# Patient Record
Sex: Female | Born: 1980
Health system: Southern US, Community
[De-identification: ages and names within clinical notes are randomized; demographics above are authoritative.]

## PROBLEM LIST (undated history)

## (undated) DIAGNOSIS — A419 Sepsis, unspecified organism: Secondary | ICD-10-CM

## (undated) DIAGNOSIS — R7881 Bacteremia: Secondary | ICD-10-CM

## (undated) DIAGNOSIS — K219 Gastro-esophageal reflux disease without esophagitis: Secondary | ICD-10-CM

## (undated) DIAGNOSIS — K649 Unspecified hemorrhoids: Secondary | ICD-10-CM

## (undated) DIAGNOSIS — R109 Unspecified abdominal pain: Secondary | ICD-10-CM

## (undated) DIAGNOSIS — Z124 Encounter for screening for malignant neoplasm of cervix: Secondary | ICD-10-CM

## (undated) HISTORY — DX: Bacteremia: R78.81

## (undated) HISTORY — DX: Unspecified abdominal pain: R10.9

## (undated) HISTORY — DX: Encounter for screening for malignant neoplasm of cervix: Z12.4

## (undated) HISTORY — DX: Unspecified hemorrhoids: K64.9

## (undated) HISTORY — DX: Sepsis, unspecified organism: A41.9

---

## 2000-10-18 ENCOUNTER — Inpatient Hospital Stay (HOSPITAL_COMMUNITY): Admission: AD | Admit: 2000-10-18 | Discharge: 2000-10-18 | Payer: Self-pay | Admitting: Obstetrics & Gynecology

## 2000-10-27 ENCOUNTER — Other Ambulatory Visit: Admission: RE | Admit: 2000-10-27 | Discharge: 2000-10-27 | Payer: Self-pay | Admitting: Obstetrics and Gynecology

## 2000-11-24 ENCOUNTER — Encounter: Payer: Self-pay | Admitting: Obstetrics and Gynecology

## 2000-11-24 ENCOUNTER — Ambulatory Visit (HOSPITAL_COMMUNITY): Admission: RE | Admit: 2000-11-24 | Discharge: 2000-11-24 | Payer: Self-pay | Admitting: Obstetrics and Gynecology

## 2001-03-29 ENCOUNTER — Emergency Department (HOSPITAL_COMMUNITY): Admission: EM | Admit: 2001-03-29 | Discharge: 2001-03-29 | Payer: Self-pay

## 2001-04-26 ENCOUNTER — Inpatient Hospital Stay (HOSPITAL_COMMUNITY): Admission: AD | Admit: 2001-04-26 | Discharge: 2001-04-30 | Payer: Self-pay | Admitting: Obstetrics and Gynecology

## 2001-06-10 ENCOUNTER — Other Ambulatory Visit: Admission: RE | Admit: 2001-06-10 | Discharge: 2001-06-10 | Payer: Self-pay | Admitting: Obstetrics and Gynecology

## 2002-01-10 ENCOUNTER — Other Ambulatory Visit: Admission: RE | Admit: 2002-01-10 | Discharge: 2002-01-10 | Payer: Self-pay | Admitting: Obstetrics and Gynecology

## 2002-09-19 ENCOUNTER — Other Ambulatory Visit: Admission: RE | Admit: 2002-09-19 | Discharge: 2002-09-19 | Payer: Self-pay | Admitting: Obstetrics and Gynecology

## 2004-09-22 ENCOUNTER — Emergency Department (HOSPITAL_COMMUNITY): Admission: EM | Admit: 2004-09-22 | Discharge: 2004-09-22 | Payer: Self-pay | Admitting: Family Medicine

## 2005-07-23 ENCOUNTER — Ambulatory Visit: Payer: Self-pay | Admitting: Family Medicine

## 2005-08-11 ENCOUNTER — Emergency Department (HOSPITAL_COMMUNITY): Admission: EM | Admit: 2005-08-11 | Discharge: 2005-08-11 | Payer: Self-pay | Admitting: Family Medicine

## 2006-01-01 ENCOUNTER — Emergency Department (HOSPITAL_COMMUNITY): Admission: EM | Admit: 2006-01-01 | Discharge: 2006-01-01 | Payer: Self-pay | Admitting: Family Medicine

## 2006-01-02 ENCOUNTER — Emergency Department (HOSPITAL_COMMUNITY): Admission: EM | Admit: 2006-01-02 | Discharge: 2006-01-02 | Payer: Self-pay | Admitting: Family Medicine

## 2006-04-14 ENCOUNTER — Emergency Department (HOSPITAL_COMMUNITY): Admission: EM | Admit: 2006-04-14 | Discharge: 2006-04-14 | Payer: Self-pay | Admitting: Emergency Medicine

## 2006-04-17 ENCOUNTER — Emergency Department (HOSPITAL_COMMUNITY): Admission: EM | Admit: 2006-04-17 | Discharge: 2006-04-17 | Payer: Self-pay | Admitting: Family Medicine

## 2006-05-12 ENCOUNTER — Emergency Department (HOSPITAL_COMMUNITY): Admission: EM | Admit: 2006-05-12 | Discharge: 2006-05-12 | Payer: Self-pay | Admitting: Emergency Medicine

## 2006-07-23 ENCOUNTER — Emergency Department (HOSPITAL_COMMUNITY): Admission: EM | Admit: 2006-07-23 | Discharge: 2006-07-24 | Payer: Self-pay | Admitting: Emergency Medicine

## 2007-01-06 ENCOUNTER — Other Ambulatory Visit: Admission: RE | Admit: 2007-01-06 | Discharge: 2007-01-06 | Payer: Self-pay | Admitting: Family Medicine

## 2008-02-14 ENCOUNTER — Inpatient Hospital Stay (HOSPITAL_COMMUNITY): Admission: AD | Admit: 2008-02-14 | Discharge: 2008-02-17 | Payer: Self-pay | Admitting: Obstetrics and Gynecology

## 2008-02-14 ENCOUNTER — Encounter (INDEPENDENT_AMBULATORY_CARE_PROVIDER_SITE_OTHER): Payer: Self-pay | Admitting: Obstetrics and Gynecology

## 2008-03-12 ENCOUNTER — Encounter: Admission: RE | Admit: 2008-03-12 | Discharge: 2008-03-12 | Payer: Self-pay | Admitting: Obstetrics and Gynecology

## 2008-12-25 ENCOUNTER — Encounter: Payer: Self-pay | Admitting: Family Medicine

## 2008-12-25 ENCOUNTER — Ambulatory Visit: Payer: Self-pay | Admitting: Family Medicine

## 2010-03-20 ENCOUNTER — Encounter: Payer: Self-pay | Admitting: Family Medicine

## 2010-03-20 ENCOUNTER — Ambulatory Visit: Payer: Self-pay | Admitting: Family Medicine

## 2010-03-20 ENCOUNTER — Ambulatory Visit (HOSPITAL_COMMUNITY): Admission: RE | Admit: 2010-03-20 | Discharge: 2010-03-20 | Payer: Self-pay | Admitting: Family Medicine

## 2010-03-20 DIAGNOSIS — M545 Low back pain: Secondary | ICD-10-CM

## 2010-03-26 ENCOUNTER — Encounter: Payer: Self-pay | Admitting: Family Medicine

## 2010-03-26 DIAGNOSIS — Q762 Congenital spondylolisthesis: Secondary | ICD-10-CM

## 2010-04-01 ENCOUNTER — Encounter: Payer: Self-pay | Admitting: Family Medicine

## 2010-04-01 ENCOUNTER — Ambulatory Visit: Payer: Self-pay | Admitting: Family Medicine

## 2010-04-18 ENCOUNTER — Ambulatory Visit: Payer: Self-pay | Admitting: Family Medicine

## 2010-04-29 ENCOUNTER — Encounter: Payer: Self-pay | Admitting: Family Medicine

## 2010-05-06 ENCOUNTER — Encounter: Admission: RE | Admit: 2010-05-06 | Payer: Self-pay | Source: Home / Self Care | Admitting: Family Medicine

## 2010-06-03 NOTE — Assessment & Plan Note (Signed)
Summary: back pain   Vital Signs:  Patient profile:   30 year old female Weight:      144.3 pounds BMI:     28.05 Temp:     98.1 degrees F oral Pulse rate:   88 / minute Pulse rhythm:   regular BP sitting:   131 / 80  (left arm)  Vitals Entered By: Loralee Pacas CMA (March 20, 2010 8:41 AM) CC: back pain and leg pain Is Patient Diabetic? No   Primary Care Provider:  Jamie Brookes  CC:  back pain and leg pain.  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 Went home and took Tylenol, and has been able to get the pain under control enough to work. It has been progressively getting worse the last year and now is hurting all the time. When sitting it hurts in her lower Rt leg and when standing it hurts in her back. Has "tight" feeling on the anterior portion of the shin. Pt has not been exercising since this happened. Says tht she "crawls" to her car after working. Left let hurts too but Rt leg is worse.   Habits & Providers  Alcohol-Tobacco-Diet     Alcohol drinks/day: 0     Tobacco Status: never  Current Medications (verified): 1)  Flexeril 10 Mg Tabs (Cyclobenzaprine Hcl) .... Take 1 Pill At Night To Help Muscles Relax and To Help You Sleep. 2)  Ibuprofen 800 Mg Tabs (Ibuprofen) .... Take 1 Pill With Food Every 8 Hours To Decrease Back Pain  Allergies (verified): No Known Drug Allergies  Review of Systems        vitals reviewed and pertinent negatives and positives seen in HPI   Physical Exam  General:  Well-developed,well-nourished,in no acute distress; alert,appropriate and cooperative throughout examination vital signs reviewed Msk:  pain with leaning forward, no pain with leaning back, tenderness over L3-5, pain over SI joint on Rt.  Rt straight leg illicits pain. tenderness over anterior tibia bilaterally. no erythema, no joint swelling   Impression & Recommendations:  Problem # 1:   BACK PAIN, LUMBAR (ICD-724.2) Assessment New Pt comes in with back pain that has worsened over the last year. She is going to get an x-ray today and will start taking Flexeril and Ibuprofen.  If not better in 2 weeks will consider PT. Asked interpretor to tell the patient all instructions and help her make an appointment for 2 weeks.   Her updated medication list for this problem includes:    Flexeril 10 Mg Tabs (Cyclobenzaprine hcl) .Marland Kitchen... Take 1 pill at night to help muscles relax and to help you sleep.    Ibuprofen 800 Mg Tabs (Ibuprofen) .Marland Kitchen... Take 1 pill with food every 8 hours to decrease back pain  Orders: Diagnostic X-Ray/Fluoroscopy (Diagnostic X-Ray/Flu) [facility]- Est Level  3 (84132)  Complete Medication List: 1)  Flexeril 10 Mg Tabs (Cyclobenzaprine hcl) .... Take 1 pill at night to help muscles relax and to help you sleep. 2)  Ibuprofen 800 Mg Tabs (Ibuprofen) .... Take 1 pill with food every 8 hours to decrease back pain  Patient Instructions: 1)  Start taking the Ibuprofen every 8 hours for the first week.  2)  You can also use a heating pad on your back for pain relief.  3)  Take the Flexeril (cyclobenzoprine) only at night because it will make you very sleepy.  4)  I want to  see you back in 2 weeks.  Prescriptions: IBUPROFEN 800 MG TABS (IBUPROFEN) take 1 pill with food every 8 hours to decrease back pain  #60 x 1   Entered and Authorized by:   Jamie Brookes MD   Signed by:   Jamie Brookes MD on 03/20/2010   Method used:   Electronically to        [facility] [phone]* (retail)       [address]       [place], [place]  [address]       Ph: [phone]       Fax: [phone]   RxID:   [id number] FLEXERIL 10 MG TABS (CYCLOBENZAPRINE HCL) take 1 pill at night to help muscles relax and to help you sleep.  #15 x 0   Entered and Authorized by:   Jamie Brookes MD   Signed by:   Jamie Brookes MD on 03/20/2010   Method used:   Electronically to        [facility] [phone]* (retail)       [address]       [place], [place]  [address]       Ph: [phone]       Fax: [phone]   RxID:   [id number]    Orders Added: 1)  Diagnostic X-Ray/Fluoroscopy [Diagnostic X-Ray/Flu] 2)  [facility]- Est Level  3 [[address]053]

## 2010-06-03 NOTE — Miscellaneous (Signed)
Summary: Back referral  Clinical Lists Changes  Problems: Added new problem of SPONDYLOLISTHESIS (ZHY-865.78) Orders: Added new Referral order of Orthopedic Surgeon Referral (Ortho Surgeon) - Signed

## 2010-06-03 NOTE — Letter (Signed)
Summary: Generic Letter  [facility]  [address]   [place], [place] [address]   Phone: [phone]  Fax: [phone]    04/01/2010    Dear Supervisor of [name],  This patient is being evaluated for back pain and my office is working on getting her in to see a back specialist. Limit her lifting to no more than 10 lbs while we are trying to get her into the specialist. If you have any questions please contact my office.      Sincerely,   Jamie Brookes MD

## 2010-06-03 NOTE — Assessment & Plan Note (Signed)
Summary: back pain f/u   Vital Signs:  Patient profile:   30 year old female Weight:      144.9 pounds Temp:     98.1 degrees F oral Pulse rate:   71 / minute Pulse rhythm:   regular BP sitting:   109 / 80  (left arm) Cuff size:   regular  Vitals Entered By: Loralee Pacas CMA (April 01, 2010 9:35 AM) CC: back pain Is Patient Diabetic? No   Primary Care [place]:  Jamie Brookes  CC:  back pain.  History of Present Illness: Back pain: Interpretor Mr. Vicki Mallet present.  No change in pain, pt is still working. Pain medicine is helping when she takes it. The Flexerill makes her very sleepy so she is using it only at night as directed. Discussed x-rays. Discussed need for back surgeon to take a look at her. Pt agrees.    Habits & Providers  Alcohol-Tobacco-Diet     Alcohol drinks/day: 0     Tobacco Status: never  Exercise-Depression-Behavior     Have you felt down or hopeless? no     Have you felt little pleasure in things? no     Depression Counseling: not indicated; screening negative for depression     Seat Belt Use: always  Current Medications (verified): 1)  Flexeril 10 Mg Tabs (Cyclobenzaprine Hcl) .... Take 1 Pill At Night To Help Muscles Relax and To Help You Sleep. 2)  Ibuprofen 800 Mg Tabs (Ibuprofen) .... Take 1 Pill With Food Every 8 Hours To Decrease Back Pain  Allergies (verified): No Known Drug Allergies  Physical Exam  General:  Well-developed,well-nourished,in no acute distress; alert,appropriate and cooperative throughout examination   Impression & Recommendations:  Problem # 1:  SPONDYLOLISTHESIS (HKV-425.95) Assessment Unchanged Pt found to have spondylolisthesis. Referral for orthopedic surgeon done already. 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 Answered questions. Will try to get Mr Vicki Mallet to be her contact person when she goes to appointments.   Orders: [facility]- Est Level  2 ([address]75)  Complete Medication List: 1)   Flexeril 10 Mg Tabs (Cyclobenzaprine hcl) .... Take 1 pill at night to help muscles relax and to help you sleep. 2)  Ibuprofen 800 Mg Tabs (Ibuprofen) .... Take 1 pill with food every 8 hours to decrease back pain  Patient Instructions: 1)  Do not lift anything over 10 lbs.  2)  We will call you with an appointment date and time to see the back doctor.    Orders Added: 1)  [facility]- Est Level  2 [64332]

## 2010-06-03 NOTE — Letter (Signed)
Summary: Out of Work  [facility]  [address]   [place], [place] [address]   Phone: [phone]  Fax: [phone]    March 20, 2010   Employee:  [name]    To Whom It May Concern:   For Medical reasons, please excuse the above named employee from work for the following dates:  Start:   03-20-10  End:   03-24-10  If you need additional information, please feel free to contact our office.         Sincerely,    Jamie Brookes MD

## 2010-06-05 NOTE — Letter (Signed)
Summary: Generic Letter  [facility]  [address]   [place], [place] [address]   Phone: [phone]  Fax: [phone]    04/29/2010  [name] [address] [place], [place]  [address]  Dear [name],  Your pap smear was normal. You will need another on in 2 years.     Sincerely,   Jamie Brookes MD

## 2010-06-05 NOTE — Assessment & Plan Note (Signed)
Summary: cpe with pap/kh   normal.    Vital Signs:  Patient profile:   30 year old female Height:      60.25 inches Weight:      144 pounds BMI:     27.99 Temp:     97.9 degrees F oral Pulse rate:   72 / minute BP sitting:   114 / 73  (left arm) Cuff size:   regular  Vitals Entered By: Jimmy Footman, CMA (April 18, 2010 9:39 AM) CC: cpe/pap Is Patient Diabetic? No Pain Assessment Patient in pain? yes     Location: back Intensity: 5 Type: sharp   Primary Care Diamante Rubin:  Jamie Brookes  CC:  cpe/pap.  History of Present Illness:  Pt is still having back pain but has no other concerns. She is doing lighter duty at work, taking care of her children and has done 2 sessions of PT for her back (which are expensive so we are going to switch her over to the [facility] PT).  Pt here for a pap as well today. Pt is taking oral contraceptives but can't remember the name. She gets them from the health department.   Habits & Providers  Alcohol-Tobacco-Diet     Tobacco Status: never  Current Medications (verified): 1)  Flexeril 10 Mg Tabs (Cyclobenzaprine Hcl) .... Take 1 Pill At Night To Help Muscles Relax and To Help You Sleep. 2)  Ibuprofen 800 Mg Tabs (Ibuprofen) .... Take 1 Pill With Food Every 8 Hours To Decrease Back Pain  Allergies (verified): No Known Drug Allergies  Family History: Reviewed history from 12/25/2008 and no changes required. Father and Mother died when she was young. They were killed by communist regime in [place].  Social History: Reviewed history from 12/25/2008 and no changes required. works in Dietitian at [employer], married to [employer], has 2 children (H'Lean Rahland 04-27-01 and Myishia Kasik 02-14-08) both born at [facility]. no pets, healthy diet  Review of Systems        vitals reviewed and pertinent negatives and positives seen in HPI   Physical Exam  General:  Well-developed,well-nourished,in no acute distress; alert,appropriate and  cooperative throughout examination Head:  Normocephalic and atraumatic without obvious abnormalities. No apparent alopecia or balding. Eyes:  No corneal or conjunctival inflammation noted. EOMI. Perrla.Vision grossly normal. Ears:  External ear exam shows no significant lesions or deformities.  Otoscopic examination reveals clear canals, tympanic membranes are intact bilaterally without bulging, retraction, inflammation or discharge. Hearing is grossly normal bilaterally. Nose:  External nasal examination shows no deformity or inflammation. Nasal mucosa are pink and moist without lesions or exudates. Mouth:  Oral mucosa and oropharynx without lesions or exudates.  Teeth in fair repair Lungs:  Normal respiratory effort, chest expands symmetrically. Lungs are clear to auscultation, no crackles or wheezes. Heart:  Normal rate and regular rhythm. S1 and S2 normal without gallop, murmur, click, rub or other extra sounds. Abdomen:  Bowel sounds positive,abdomen soft and non-tender without masses, organomegaly or hernias noted. Genitalia:  Normal introitus for age, no external lesions, no vaginal discharge, mucosa pink and moist, no vaginal or cervical lesions, no vaginal atrophy, no friaility or hemorrhage, normal uterus size and position, no adnexal masses or tenderness Msk:  No deformity or scoliosis noted of thoracic or lumbar spine.   Extremities:  No clubbing, cyanosis, edema, or deformity noted with normal full range of motion of all joints.   Neurologic:  No cranial nerve deficits noted. Station and gait are  normal.  Skin:  Intact without suspicious lesions or rashes Psych:  Cognition and judgment appear intact. Alert and cooperative with normal attention span and concentration. No apparent delusions, illusions, hallucinations   Impression & Recommendations:  Problem # 1:  HEALTH MAINTENANCE EXAM (ICD-V70.0) Assessment Unchanged  Pt is doing well, no major concerns other than her back pain.  she is going to PT and we are going to try to help her get PT at the [facility] facility so she doesn't have to pay 100 dollars every time she sees them.   Orders: [facility] - Est  18-39 yrs ([id number])  Problem # 2:  SCREENING FOR MALIGNANT NEOPLASM OF THE CERVIX (ICD-V76.2) Assessment: Unchanged Pap done today. Will call with results.   Orders: Pap Smear-[facility] ([id number]) [facility] - Est  18-39 yrs ([address]65)  Complete Medication List: 1)  Flexeril 10 Mg Tabs (Cyclobenzaprine hcl) .... Take 1 pill at night to help muscles relax and to help you sleep. 2)  Ibuprofen 800 Mg Tabs (Ibuprofen) .... Take 1 pill with food every 8 hours to decrease back pain  Other Orders: Physical Therapy Referral (PT)  Patient Instructions: 1)  Call dentist at [phone] 2)  I will send you a letter with your results from today's pap smear.  3)  We will call you with the new time/date for the Physical Therapy.  4)  You don't need to schedule another appointment for 1 year, but please feel free to come in sooner if you have questions or concerns.    Orders Added: 1)  Pap Smear-[facility] [[id number]] 2)  Physical Therapy Referral [PT] 3)  [facility] - Est  18-39 yrs [[id number]]

## 2010-09-16 NOTE — H&P (Signed)
NAMEJOSSELYN, Avila                    ACCOUNT NO.:  [id number]   MEDICAL RECORD NO.:  [id number]          PATIENT TYPE:  MAT   LOCATION:  [facility]                          [facility]ILITY:  [facility]   PHYSICIAN:  Osborn Coho, M.D.   DATE OF BIRTH:  08-Oct-1980   DATE OF ADMISSION:  02/14/2008  DATE OF DISCHARGE:                              HISTORY & PHYSICAL   [name] is a 30 year old single Asian female gravida 2, para 1-0-0-1 at 33-  6/7 weeks who presented with chief complaint of SROM at approximately  7:15 a.m. meconium and in active labor.  She presented to the office and  per Nigel Bridgeman, CNM, exam was 5-6 cm dilated with breech presentation.  She was immediately sent to [facility] via EMS to  proceed with a repeat cesarean section secondary to breech presentation.  She had been followed by the CNM service at [facility].   HISTORY:  1. Previous C-section had previously desired a VBAC.  2. History of abnormal Pap  3. Language barrier.  4. Reflux.  5. Group beta strep negative.   OBSTETRICAL HISTORY:  Rhett Bannister 1 was a C-section female in December 2002  by the name of Leah weighing 7 pounds 11 ounces at [redacted] weeks gestation  after 48 hours of labor.  That was by Dr. Pennie Rushing.  Vacuum-assisted.  Gravida 2 is current pregnancy.  Just to note, first delivery with  failure to descend after pushing for 2 hours.   PAST MEDICAL HISTORY:  She denies medication or latex allergies.  Has  used Depo-Provera for birth control in the past.  Last Pap smear was in  January 2009 was normal at [facility].  She reports menarche at age 21.  Cycles every 28-29 days, 3 days in length.  Just fatigue during her  cycles, but no other abnormalities.  She reported an LMP of July 02, 2007 giving her a best [facility] of April 07, 2008.  She had an early  ultrasound with size less than dates giving her best [facility] of February 29, 2008 via ultrasound.  She does have a history of acid reflux and chronic  constipation.   The patient does not know her family history.  Her  surgical history is remarkable for the previous C-section.  There is no  genetic history.   SOCIAL HISTORY:  She is a single [place] female.  She is of [place]  descent.  Father of baby's name is Geophysicist/field seismologist.  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  She denied alcohol, tobacco or  illicit drug use.  She entered care August 24, 2007 for new OB interview  was approximately 7-4/7 weeks and she returned for new OB workup on May  11 was approximately 10-2/7 weeks.  Planned CNM care, hoped for VBAC.  Was prescribed Tums for reflux.  Her weight was 133.  She reported that  her pre gravid weight was 112.  She is 5 feet tall.  Fetal heart tones  were heard that day.   PRENATAL LABS:  Blood type is  O positive, Rh antibody screen negative.  Sickle cell negative.  RPR nonreactive, rubella titer immune.  Hepatitis  surface antigen negative, HIV nonreactive.  She had a urinary tract  infection July 18, 2007.  Gonorrhea and chlamydia cultures were  declined.  Hematocrit 38.9 and hemoglobin 12.2, platelets were 171.  First trimester screen was within normal limits.  EDC was changed to  February 29, 2008 on Sep 29, 2007.  June 25, complained of some left hip  pain especially when she was sleeping.  Comfort measures were reviewed.  Continued to complain of some reflux.  She was given a note for her  employer for frequent breaks, no heavy lifting, left hip sciatica  continued into latter part of second trimester.  She was prescribed some  Vicodin for that with some relief.  Had her Glucola at 27-5/7 weeks and  was within normal limits equal to 121.  Her hemoglobin at that time was  11.9.  She is expecting another female.  She saw Dr. Pennie Rushing around 30  weeks for the [facility] consent.  She had GBS done at 33-6/7 weeks and it was  negative.  Pregnancy continued to progress without any other significant  complications until her presentation  today.  There is a language barrier  and she has had interpreters who have been with her in the past, Dorene Sorrow,  as well as her for [employer].  She did not have anybody with her this  morning.   OBJECTIVE:  VITAL SIGNS:  Blood pressure on admission 95/61, heart rate  59.  She is afebrile.  Fetal heart rate 133 reactive.  No decelerations  with moderate variability.  Toco uterine contractions every 2 to 3  minutes.  GENERAL:  Labored breathing with moderate perspiration with her  contractions.  She is alert and oriented x3.  HEENT:  Grossly intact and within normal limits.  CARDIOVASCULAR:  Regular rate and rhythm without murmur.  LUNGS:  Clear to auscultation bilaterally.  ABDOMEN:  Soft, nontender and gravid.  PELVIC:  Deferred.  She has not been examined since arrival to the  hospital since being at the office.  EXTREMITIES:  Without edema and no clonus.   IMPRESSION:  1. Intrauterine pregnancy at 37-6/7 weeks.  2. Active labor.  3. Breech presentation.  4. Meconium-stained fluid.  5. Previous C-section.   PLAN:  1. Admit to [facility] with Dr. Osborn Coho as      attending with routine preoperative orders.  2. Risks, benefits, alternatives discussed with the patient regarding      need for repeat C-section secondary to breech presentation and the      patient is agreeable to proceed.  The risks benefits and      alternatives were explained via help of translator on language      line.      Candice Denny Levy, CNM      ______________________________  Osborn Coho, M.D.    CHS/MEDQ  D:  02/14/2008  T:  02/14/2008  Job:  [id number]

## 2010-09-16 NOTE — Op Note (Signed)
NAMESYD, MANGES                    ACCOUNT NO.:  [id number]   MEDICAL RECORD NO.:  [id number]          PATIENT TYPE:  INP   LOCATION:  9117                          [facility]ILITY:  [facility]   PHYSICIAN:  Osborn Coho, M.D.   DATE OF BIRTH:  1980/12/03   DATE OF PROCEDURE:  DATE OF DISCHARGE:                               OPERATIVE REPORT   PREOPERATIVE DIAGNOSES:  1. 37-6/7 weeks.  2. Spontaneous rupture of membranes in labor.  3. Breech.  4. Repeat C-section.   POSTOPERATIVE DIAGNOSES:  1. 37-6/7 weeks.  2. Spontaneous rupture of membranes in labor.  3. Breech.  4. Repeat C-section.   PROCEDURE:  Repeat C-section.   ATTENDING:  Osborn Coho, MD   ASSISTANT:  Larna Daughters, CNM   ANESTHESIA:  General via ET tube and failed spinal.   FLUIDS:  1800 mL.   URINE OUTPUT:  225 mL.   ESTIMATED BLOOD LOSS:  800 mL.   Placenta to Pathology.   FINDINGS:  Normal-appearing bilateral ovaries and fallopian tubes.  Live  female infant with Apgars of 9 at 1 minute and 9 at 5 minutes weighing 5  pounds 14 ounces.   COMPLICATIONS:  None.   PROCEDURE:  The patient was taken to the operating room, and after the  risks, benefits, and alternatives were discussed with the patient via  translator [phone], the risks included but were not limited to bleeding,  infection, and injury.  The patient verbalized understanding and consent  was signed and witnessed.  The patient was then taken to the operating  room where a spinal was administered per anesthesia, and the patient was  prepped and draped in a normal sterile fashion in the supine position.  A Pfannenstiel skin incision was made at the site of the prior scar and  carried out to the underlying layer of fascia with the scalpel.  The  fascia was excised bilaterally in the midline and extended bilaterally  with the Mayo scissors.  The Kocher clamps were placed on the inferior  aspect of fascial incision, and the rectus muscle excised  from the  fascia.  The same was done on the superior aspect of the fascial  incision.  The muscle was separated in midline, and the peritoneum  entered bluntly and extended manually.  The bladder blade was placed,  and bladder flap created with the Metzenbaum scissors.  The incision was  made with a scalpel and extended manually and with the bandage scissors  bilaterally.  The infant was delivered in the breech presentation with  the buttocks delivering first and the infant was delivered without  difficulty and cord clamped and cut, and infant handed to the waiting  pediatricians.  The infant was noted to immediately spontaneously cry,  and the placenta was removed via fundal massage.  The uterus was cleared  of all clots and debris, and the uterine incision was repaired with 0  Vicryl in a running locked fashion, and a second imbricating layer was  performed.  The intra-abdominal cavity was copiously irrigated, and  normal-appearing bilateral ovaries and  fallopian tubes were noted.  The  uterine incision was noted to be hemostatic, and the peritoneum was  repaired with 2-0 chromic in a running fashion.  The muscle was  irrigated and made hemostatic with the Bovie, and the fascia was  repaired with 0 Vicryl in a running fashion.  The subcutaneous tissue  was irrigated and made hemostatic with the Bovie and reapproximated  using 3 interrupted stitches of 2-0 plain.  The skin was reapproximated  using 3-0 Monocryl via a subcuticular stitch.  A small amount of  Dermabond was applied to the right lateral aspect of the incision, and  half-inch Steri-Strips were applied with Benzoin.  Pressure dressing was  applied as well.  Sponge, lap, and needle count was correct.  The  patient tolerated the procedure well and is currently awaiting  extubation and transfer to the recovery room in good condition.      Osborn Coho, M.D.  Electronically Signed     AR/MEDQ  D:  02/14/2008  T:   02/15/2008  Job:  [id number]

## 2010-09-16 NOTE — Discharge Summary (Signed)
[name], [name]                    ACCOUNT NO.:  [id number]   MEDICAL RECORD NO.:  [id number]          PATIENT TYPE:  INP   LOCATION:  9117                          FACILITY:  [facility]   PHYSICIAN:  Hal Morales, M.D.DATE OF BIRTH:  09-May-1980   DATE OF ADMISSION:  02/14/2008  DATE OF DISCHARGE:  02/17/2008                               DISCHARGE SUMMARY   ADMITTING DIAGNOSES:  1. Intrauterine pregnancy at 37-6/7th weeks.  2. Active labor.  3. Breech presentation.  4. Meconium-stained fluid.  5. Previous cesarean section.   PROCEDURE:  Repeat low-transverse cesarean section.   HOSPITAL COURSE:  [name] is a 30 year old gravida 2, para 1-0-0-1 at 58-  6/7th weeks who was admitted on February 14, 2008, in the morning from  the office secondary to spontaneous rupture of membrane at 7:15 a.m.  with meconium fluid noted.  Cervix was 5-6 and breech presentation was  identified in the office.  The patient was sent to [facility] via  EMS to proceed with repeat cesarean section.  Her history is remarkable  for:  1. Previous cesarean section with previous desire for VBAC.  2. History of abnormal pap.  3. Language barrier.  4. Reflux.  5. Group B strep negative.   The patient on arrival was taken to the operating room where a repeat  low-transverse cesarean section was performed by Dr. Su Hilt under  general anesthesia secondary to a failed spinal.  Findings were viable  female, by the name of [name], weight 5 pounds 14 ounces, Apgars were 9  and 9.  Infant was taken to the full-term nursing.  Mother was taken to  recovery in good condition.  On postop day #1, the patient is doing  well.  She was up ad lib. She is having no syncope or dizziness.  Her  throat was sore from the general anesthesia but had no other problems.  Hemoglobin was 9.7, white blood cell count 11.8, platelet count was 124.  Postoperative values, hemoglobin was 12.1, white blood cell count was  10.7, and platelet  count was 150.  Her physical exam was within normal  limits.  Her incision was clean, dry, and intact.  She had orthostatic  blood pressures and pulses done that were within normal limits.  She was  placed on iron 1 p.o. daily.  The rest of the patient's hospital stay  was uncomplicated.  By postop day #3, she was doing well.  She was up ad  lib.  Infant was doing well.  The patient was breastfeeding and bottle  feeding.  Her incision was clean, dry, and intact.  Fundus was firm.  Lochia was scant.  She was deemed to receive full benefit of hospital  stay and was discharged home.   DISCHARGE INSTRUCTIONS:  Per [facility] handout.   DISCHARGE MEDICATIONS:  Motrin 600 mg p.o. q.6 h. p.r.n. pain, Percocet  5/325 one to two p.o. q.2-4 h. p.r.n. pain.  The patient was then  decided regarding contraception.   DISCHARGE FOLLOWUP:  Occur in 6 weeks in [place]  OBRenaldo Reel Emilee Hero, C.N.M.      Hal Morales, M.D.  Electronically Signed    VLL/MEDQ  D:  02/17/2008  T:  02/17/2008  Job:  [id number]

## 2010-09-19 NOTE — Discharge Summary (Signed)
[facility]  PatientDENINE, [name] Visit Number: [id number] MRN: [id number]          Service Type: OBS Location: 910A 9136 01 Attending Physician:  Shaune Spittle Dictated by:   Philipp Deputy, C.N.M. Admit Date:  04/26/2001 Discharge Date: 04/30/2001                             Discharge Summary  DATE OF BIRTH:                February 15, 1981  ADMISSION DIAGNOSES:          1. Term pregnancy.                               2. Spontaneous rupture of membranes.                               3. Early labor.  DISCHARGE DIAGNOSES:          1. Term pregnancy.                               2. Failure to descend.                               3. Meconium-stained amniotic fluid.  PROCEDURE:                    1. Primary low transverse cesarean section.                               2. Epidural anesthesia.  HOSPITAL COURSE:              [name]. is a 30 year old married [place] female, gravida 1, para 0, at 41-2/7 weeks, who presented with leaking, thin meconium-stained fluid at 4 a.m. on April 26, 2001.  She was contracting approximately every three minutes and was very uncomfortable and was admitted for early labor and spontaneous rupture of membranes.  Her pregnancy has been followed by the [facility] M.D. service and has been essentially uncomplicated, but at risk for 1) Questionable last menstrual period; 2) First trimester exposure to MMR; 3) Language barrier; 4) Abnormal Pap smear. Patient progressed to 10 cm dilation at approximately midnight on April 27, 2001 and began pushing, which lasted greater than two hours, without any significant progress.  She brought the vertex down to a +1 to +2 station and options of cesarean section versus continued pushing were explained to the patient through the interpreter and patient wished to proceed with a cesarean section.  Patient tolerate the procedure well, with delivery of a  viable female infant.  Weight was 7 pounds and 11 ounces.  Apgars were 9 and 9. Infant was taken to the full-term nursery and mom was taken to recovery in good condition.  By postoperative day #1, patient was doing well.  Physical exam was within normal limits.  Her maximum temperature had reached 101 by 6:30 a.m. on April 27, 2001, with a gradual decrease since that time and no further temperature elevation.  Her hemoglobin was 8.8 and had been 12.6 upon admission.  She  was tolerating a regular diet and the rest of the postoperative stay was unremarkable.  She was breast-feeding and planned to discussed contraception at her six-week postpartum visit.  By postoperative day 3, she was deemed to have received the benefit of her hospital stay and was discharged home.  DISCHARGE INSTRUCTIONS:       Per [facility] handout.  DISCHARGE MEDICATIONS:        1. Motrin 600 mg p.o. q.6h. p.r.n. pain.                               2. Tylox 1-2 p.o. q.3-4h. p.r.n. pain.                               3. Iron supplement one p.o. b.i.d.  DISCHARGE FOLLOW-UP:          Will occur at six weeks at [facility].  All information was presented to the patient through her translator, Quita Skye. Dictated by:   Philipp Deputy, C.N.M. Attending Physician:  Shaune Spittle DD:  04/30/01 TD:  04/30/01 Job: [id number]

## 2010-09-19 NOTE — Op Note (Signed)
[facility]  PatientDAYSY, [name] Visit Number: [id number] MRN: [id number]          Service Type: OBS Location: 910A 9136 01 Attending Physician:  Shaune Spittle Dictated by:   Maris Berger. Pennie Rushing, M.D. Proc. Date: 04/27/01 Admit Date:  04/26/2001                             Operative Report  PREOPERATIVE DIAGNOSES:       1. Failure to descend.                               2. Meconium-stained amniotic fluid.  POSTOPERATIVE DIAGNOSES:      1. Failure to descend.                               2. Meconium-stained amniotic fluid.  OPERATION:                    Primary low transverse cesarean section.  SURGEON:                      Vanessa P. Pennie Rushing, M.D.  ASSISTANT:                    Wynelle Bourgeois, CNM  ANESTHESIA:                   Epidural.  ESTIMATED BLOOD LOSS:         750 cc.  COMPLICATIONS:                None.  FINDINGS:                     The uterus, tubes, and ovaries were normal for he gravid state. The patient was delivered of a female infant weighing 7 pounds 11 ounces with Apgars of 9 and 9 at one and five  minutes respectively.  DESCRIPTION OF PROCEDURE:     The patient was taken the operating room after appropriate identification and after a conversation via her interpreter concerning the risks of cesarean section and options for management of failure to descend after two hours of pushing, which include continued pushing and cesarean section in the presence of failure to descend to a station that is appropriate for operative vaginal delivery. The patient consented and was taken to the operating room and placed on the operating table. Her Foley catheter and labor epidural were in place. The labor epidural was dosed for surgical anesthesia. The abdomen was prepped with multiple layers of Betadine and draped as a sterile field. After assurance of adequate anesthesia, a transverse incision was made in the abdomen  and the abdomen opened in layers. The peritoneum was entered. The uterus was incised approximately 2 cm above the uterovesical fold and the infant delivered from the occiput transverse position with the aid of a Mityvac vacuum extractor. The nares and pharynx were DeLee suctioned and the remainder of the infant delivered, then the cord clamped and cut, then the infant handed off to the awaiting pediatricians. The appropriate cord blood was drawn and the placenta noted to have separated from the uterus and was removed from the operative field. The uterine incision was closed with a running interlocking suture of 0 Vicryl. An imbricating  suture of 0 Vicryl was placed. Hemostatic figure-of-eight sutures allowed adequate hemostasis. The serosal surface was then reapproximated in the midline and copious irrigation carried out to allow documentation of adequate hemostasis. The abdominal peritoneum was then closed with a running suture of 2-0 Vicryl. The rectus muscles were reapproximated with a figure-of-eight suture of 2-0 Vicryl. The rectus muscles were irrigated and made hemostatic with Bovie cautery. The rectus fascia was closed with a running suture of 0 Vicryl then reinforced on either side of midline with figure-of-eight sutures of 0 Vicryl. The subcutaneous tissue was irrigated and made hemostatic with Bovie cautery. Skin staples were applied to the skin incision. Sterile dressing was applied. The infant went to the full-term nursery. The patient was given 2 g of Ancef after clamping of the cord. Once her dressing was in place, she was taken to postanesthesia care in stable condition. Dictated by:   Maris Berger. Pennie Rushing, M.D. Attending Physician:  Shaune Spittle DD:  04/27/01 TD:  04/28/01 Job: [id number]

## 2010-09-19 NOTE — H&P (Signed)
[facility]  PatientMONISHA, Avila Visit Number: [id number] MRN: [id number]          Service Type: OBS Location: 910B 9162 01 Attending Physician:  Shaune Spittle Dictated by:   Saverio Danker, C.N.M. Admit Date:  04/26/2001                           History and Physical  HISTORY OF PRESENT ILLNESS:   [name] is a 30 year old married [place] female, gravida 1, para 0, at 41-2/7th weeks by ultrasound who presents complaining of leaking thin meconium stained fluid since about 4 a.m. this morning.  She reports the onset of uterine contraction subsequently and now reports that they are every three minutes an is very uncomfortable.  She is requesting something for pain.  She denies any nausea, vomiting, headaches, or visual disturbances.  Her pregnancy has been followed at [facility] by the doctor service and has been essentially uncomplicated, though at risk for a questionable LMP and first trimester exposure to MMR vaccine.  She also has a language barrier and the above information was obtained via her interpreter.  OBSTETRICAL/GYNECOLOGIC:      She is a primigravida with an unknown LMP and an EDC by ultrasound of April 17, 2001.  ALLERGIES:                    She has no known drug allergies.  PAST MEDICAL HISTORY:         She reports having had immunization of [place] on admittance to the [place] in April of 2002.  She reports occasional urinary tract infection.  Had Malaria in 2001.  FAMILY HISTORY:               Noncontributory.  GENETIC HISTORY:              Negative.  SOCIAL HISTORY:               She is married to [name].  There are of the [place] faith.  They deny any illicit drug use, alcohol, or smoking with this pregnancy.  PRENATAL LABORATORY DATA:     Blood type is O positive.  Antibody screen is negative.  Sickle trait is negative.  Syphilis is nonreactive.  Rubella is immuned.   Hepatitis B surface-antigen is negative.  HIV is nonreactive.  Pap in June showed low grade SIL with GC and chlamydia both negative.  One hour Glucola was 89 and her 36 week beta strep was negative.  PHYSICAL EXAMINATION:  VITAL SIGNS:                  Vital signs are stable.  She is afebrile.  HEENT:                        Within normal limits.  HEART:                        Regular rhythm and rate.  CHEST:                        Clear.  BREASTS:                      Soft and nontender.  ABDOMEN:  Gravid with uterine contractions every two minutes.  Fetal heart rate is reactive and reassuring.  PELVIC:                       Her cervix is a tight 2, 70% vertex, -1 to -2 with thin, meconium-stained fluid.  A change from 2, 70% vertex, and a -3 in the office.  EXTREMITIES:                  Within normal limits.  ASSESSMENT:                   1. Intrauterine pregnancy at 41-2/7th weeks.                               2. Early labor.                               3. Spontaneous rupture of the membranes at 4                                  a.m. with thin, meconium-stained fluid.                               4. Group B strep is negative.  PLAN:                         Admit to labor and delivery and Dr. Erie Noe P. Haygood is aware of the patients admission and will follow the patient.  She will allow 1 mg of Stadol IV q.2h. p.r.n. and 12.5 mg of Phenergan IV q.3h. p.r.n.  Dictated by:   Vance Gather Duplantis, C.N.M. Attending Physician:  Shaune Spittle DD:  04/26/01 TD:  04/26/01 Job: [id number]

## 2011-02-02 LAB — CBC
HCT: 37.9
Hemoglobin: 12.1
Hemoglobin: 9.7 — ABNORMAL LOW
MCHC: 32.7
MCV: 74.8 — ABNORMAL LOW
MCV: 75.1 — ABNORMAL LOW
Platelets: 150
WBC: 10.7 — ABNORMAL HIGH
WBC: 11.8 — ABNORMAL HIGH

## 2011-04-15 ENCOUNTER — Encounter: Payer: Self-pay | Admitting: Family Medicine

## 2011-04-15 ENCOUNTER — Ambulatory Visit (INDEPENDENT_AMBULATORY_CARE_PROVIDER_SITE_OTHER): Payer: 59 | Admitting: Family Medicine

## 2011-04-15 VITALS — BP 105/73 | HR 81 | Ht 60.25 in | Wt 138.0 lb

## 2011-04-15 DIAGNOSIS — A048 Other specified bacterial intestinal infections: Secondary | ICD-10-CM

## 2011-04-15 DIAGNOSIS — K219 Gastro-esophageal reflux disease without esophagitis: Secondary | ICD-10-CM

## 2011-04-15 LAB — POCT H PYLORI SCREEN: H Pylori Screen, POC: POSITIVE

## 2011-04-15 MED ORDER — AMOXICILLIN 500 MG PO CAPS
1000.0000 mg | ORAL_CAPSULE | Freq: Two times a day (BID) | ORAL | Status: AC
Start: 2011-04-15 — End: 2011-04-25

## 2011-04-15 MED ORDER — OMEPRAZOLE 20 MG PO CPDR: 20.0000 mg | DELAYED_RELEASE_CAPSULE | Freq: Two times a day (BID) | ORAL | Status: DC

## 2011-04-15 MED ORDER — CLARITHROMYCIN 500 MG PO TABS: 500.0000 mg | ORAL_TABLET | Freq: Two times a day (BID) | ORAL | Status: AC

## 2011-04-15 MED ORDER — OMEPRAZOLE 20 MG PO CPDR: 20.0000 mg | DELAYED_RELEASE_CAPSULE | Freq: Every day | ORAL | Status: DC

## 2011-04-15 NOTE — Assessment & Plan Note (Addendum)
Sxs highly consistent with reflux. Will also test for h pylori. Will start pt on prilosec. If h pylori positive, will expand treatment and perform TOC in coming weeks(Urea breath testing performed at least four weeks after treatment). Conversely, if sxs persist despite PPI tx (and pt is h pylori negative), may consider expanding workup to look for other sources of abd pain including GB and pancreas.  Pt also instructed to follow up with PCP in 1 month for general visit.

## 2011-04-15 NOTE — Progress Notes (Signed)
Pt noted to be h pylori positive. Will treat with triple therapy of PPI, amoxicillin and calrithromycin. Pt notified of results and treatment.

## 2011-04-15 NOTE — Patient Instructions (Signed)

## 2011-04-15 NOTE — Progress Notes (Signed)
  Subjective:    Patient ID: [name], female    DOB: 11/07/1980, 30 y.o.   MRN: [id number]  HPI Pt is here for evaluation of reflux type sxs. (Pt is worker through family friend who is informally interpreting. Pt speaks very remote [place] dialect)Pt states that she has noticed epigastric pain, referred espohageal/heartburn sxs that are most predominant at night as well as in the morning. Pt states that she has been using OTC zantac with minimal relief in sxs. Pt states that sxs were initially improved with zantac. However, sxs have began to return. Pt denies any recent high dose NSAID use. Pt is a non smoker. Pt denies any ETOH intake. Pt states that she eat some food cooked in grease, but does not eat a lot of fried foods. Pt denies that sxs worsen or improve with eating. No nausea, vomiting, cough. No RUQ pain. No diarrhea. Pt states that she has not had sxs like this in the past.    Review of Systems See HPI, otherwise 12 point ROS negative.     Objective:   Physical Exam Gen: up in chair, NAD HEENT: NCAT, EOMI, TMs clear bilaterally CV: RRR, no murmurs auscultated PULM: CTAB, no wheezes, rales, rhoncii ABD: S/+ bowel sounds/+ mild epigastric tenderness  EXT: 2+ peripheral pulses   Assessment & Plan:

## 2011-04-15 NOTE — Progress Notes (Signed)
Addended by: Floydene Flock on: 04/15/2011 10:53 AM   Modules accepted: Orders

## 2011-04-30 ENCOUNTER — Telehealth: Payer: Self-pay | Admitting: Family Medicine

## 2011-04-30 NOTE — Telephone Encounter (Signed)
LVM for [name] the interpretor to call back. Wanting to find out how long this medication has been making the patient lose sleep, if it has been since the beginning of starting it and if there are any more sideaffects going on

## 2011-04-30 NOTE — Telephone Encounter (Signed)
Spoke with interpretor and she states that patient has told her that she has not been taking the antibiotics that have been prescribed to her. She has taken them 2 days and was wondering if there was anything eslse that could be prescribed.

## 2011-04-30 NOTE — Telephone Encounter (Signed)
States that the meds that was given last week is causing her to not being about to sleep.  pls advise

## 2011-05-02 MED ORDER — METRONIDAZOLE 500 MG PO TABS: 500.0000 mg | ORAL_TABLET | Freq: Three times a day (TID) | ORAL | Status: AC

## 2011-05-02 NOTE — Telephone Encounter (Signed)
Sent in script for 14 days of flagyl. For 2nd line treatment of h pylori with clarithromycin already prescribed.

## 2011-06-25 ENCOUNTER — Ambulatory Visit (INDEPENDENT_AMBULATORY_CARE_PROVIDER_SITE_OTHER): Payer: 59 | Admitting: Family Medicine

## 2011-06-25 ENCOUNTER — Encounter: Payer: Self-pay | Admitting: Family Medicine

## 2011-06-25 DIAGNOSIS — K259 Gastric ulcer, unspecified as acute or chronic, without hemorrhage or perforation: Secondary | ICD-10-CM

## 2011-06-25 LAB — POCT H PYLORI SCREEN: H Pylori Screen, POC: NEGATIVE

## 2011-06-25 MED ORDER — ESOMEPRAZOLE MAGNESIUM 40 MG PO CPDR: 40.0000 mg | DELAYED_RELEASE_CAPSULE | Freq: Every day | ORAL | Status: DC

## 2011-06-25 NOTE — Patient Instructions (Signed)
Peptic Ulcers Ulcers are small, open craters or sores that develop in the lining of the stomach or the duodenum (the first part of the small intestine). The term peptic ulcer is used to describe both types of ulcers. There are a number of treatments that relieve the discomfort of ulcers. In most cases ulcers do heal.   CAUSES AND COMMON FEATURES OF PEPTIC ULCERS   Peptic ulcers occur only in areas of the digestive system that come in contact with digestive juices. These juices are secreted (given off) by the stomach. They include acid and an enzyme called pepsin that breaks down proteins. Many people with duodenal ulcers have too much digestive juice spilling down from the stomach. Most people with gastric (stomach) ulcers have normal or below normal amounts of stomach acid. Sometimes, when the mucous membrane (protective lining) of the stomach and duodenum does not protect well, this may add to the growth of ulcers. Duodenal ulcers often produces pain in a small area between the breastbone and navel. Pain varies from hunger pain to constant gnawing or burning sensations (feeling). Sometimes the pain is felt during sleep and may awaken the person in the middle of the night. Often the pain occurs two or three hours after eating, when the stomach is empty. Other common symptoms (problems) include overeating for pain relief. Eating relieves the pain of a duodenal ulcer. Gastric ulcer pain may be felt in the same place as the pain of a duodenal ulcer, or slightly higher up. There may also be sensations of feeling full, indigestion, and heartburn. Sometimes pain occurs when the stomach is full. This causes loss of appetite followed by weight loss. HOME CARE INSTRUCTIONS    Use of tobacco products have been found to slow down the healing of an ulcer. STOP SMOKING.     Avoid alcohol, aspirin, and other inflammation (swelling and soreness) reducing drugs. These substances weaken the stomach lining.     Eat  regular, nutritious meals.     Avoid foods that bother you.     Take medications and antacids as directed. Over-the-counter medications are used to neutralize stomach acid. Prescription medications reduce acid secretion, block acid production, or provide a protective coating over the ulcer. If a specific antacid was prescribed, do not switch brands without your caregiver's approval.  Surgery is usually not necessary. Diet and/or drug therapy usually is effective. Surgery may be necessary if perforation, obstruction due to scarring, or uncontrollable bleeding is found, or if severe pain is not otherwise controlled. SEEK IMMEDIATE MEDICAL CARE IF:  You see signs of bleeding. This includes vomiting fresh, bright red blood or passing bloody or tarry, black stools.     You suffer weakness, fatigue, or loss of consciousness. These symptoms can result from severe hemorrhaging (bleeding). Shock may result.     You have sudden, intense, severe abdominal (belly) pain. This is the first sign of a perforation. This would require immediate surgical treatment.     You have intense pain and continued vomiting. This could signal an obstruction of the digestive tract.  Document Released: 04/17/2000 Document Revised: 12/31/2010 Document Reviewed: 04/16/2008 [facility] Patient Information 2012 [place], [place].

## 2011-06-25 NOTE — Assessment & Plan Note (Signed)
Will test for hpylori Nexium 40 mg daily. Avoid ulcerogenic foods and medication

## 2011-06-25 NOTE — Progress Notes (Signed)
  Subjective:    Patient ID: [name], female    DOB: [birth year], 31 y.o.   MRN: [id number]  HPI 1. Abdominal pain History was difficult because patient does not speak english fluently, she was able to understand our discussion however.  C/o 2 month history of epigastric abdominal pain that is constant and not relieved by anything. Pain radiates to back. Pain is a burning like spicy food. Patient has no food related pain. No alcohol, or drugs. No medications. No recent travel. She is [place]. She has history of GERD. She has no history of pancreatitis. Her vitals were normal. She has no NSAID use.  Review of Systems Pertinent items are noted in HPI. No fever, chills, night sweats, weight loss.    Objective:   Physical Exam Filed Vitals:   06/25/11 1035  BP: 107/71  Pulse: 75  Temp: 97.3 F (36.3 C)  TempSrc: Oral  Height: 5' 0.25" (1.53 m)  Weight: 140 lb 8 oz (63.73 kg)  Abdomen: soft and non-tender without masses, organomegaly or hernias noted.  No guarding or rebound    Assessment & Plan:

## 2011-07-05 ENCOUNTER — Encounter (HOSPITAL_COMMUNITY): Payer: Self-pay | Admitting: *Deleted

## 2011-07-05 ENCOUNTER — Emergency Department (INDEPENDENT_AMBULATORY_CARE_PROVIDER_SITE_OTHER)
Admission: EM | Admit: 2011-07-05 | Discharge: 2011-07-05 | Disposition: A | Discharge status: Home / Self Care | Payer: PRIVATE HEALTH INSURANCE | Source: Home / Self Care | Attending: Emergency Medicine | Admitting: Emergency Medicine

## 2011-07-05 DIAGNOSIS — L509 Urticaria, unspecified: Secondary | ICD-10-CM

## 2011-07-05 HISTORY — DX: Gastro-esophageal reflux disease without esophagitis: K21.9

## 2011-07-05 MED ORDER — EPINEPHRINE 0.3 MG/0.3ML IJ DEVI: 0.3000 mg | Freq: Once | INTRAMUSCULAR | Status: DC

## 2011-07-05 MED ORDER — PREDNISONE 20 MG PO TABS: 60.0000 mg | ORAL_TABLET | Freq: Every day | ORAL | Status: AC

## 2011-07-05 MED ORDER — FEXOFENADINE HCL 60 MG PO TABS: 60.0000 mg | ORAL_TABLET | Freq: Two times a day (BID) | ORAL | Status: DC

## 2011-07-05 MED ORDER — PREDNISONE 20 MG PO TABS
60.0000 mg | ORAL_TABLET | Freq: Once | ORAL | Status: AC
  Administered 2011-07-05: 60 mg via ORAL

## 2011-07-05 MED ORDER — PREDNISONE 20 MG PO TABS
ORAL_TABLET | ORAL | Status: AC
  Filled 2011-07-05: qty 3

## 2011-07-05 MED ORDER — METHYLPREDNISOLONE SODIUM SUCC 125 MG IJ SOLR: 125.0000 mg | Freq: Once | INTRAMUSCULAR | Status: DC

## 2011-07-05 MED ORDER — DIPHENHYDRAMINE HCL 25 MG PO CAPS: 50.0000 mg | ORAL_CAPSULE | Freq: Once | ORAL | Status: DC

## 2011-07-05 MED ORDER — FAMOTIDINE 20 MG PO TABS: 20.0000 mg | ORAL_TABLET | Freq: Two times a day (BID) | ORAL | Status: DC

## 2011-07-05 NOTE — ED Notes (Signed)
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  States was using the same chemicals she always does; came out of room and noticed urticaria.  Has not taken any meds for sxs.

## 2011-07-05 NOTE — ED Provider Notes (Signed)
History     CSN: [id number]  Arrival date & time 07/05/11  1538   First MD Initiated Contact with Patient 07/05/11 1543      Chief Complaint  Patient presents with  . Urticaria   Filed Vitals:   07/05/11 1650  BP: 117/81  Pulse: 78  Temp: 97.9 F (36.6 C)  TempSrc: Oral  Resp: 18  SpO2: 97%   (Consider location/radiation/quality/duration/timing/severity/associated sxs/prior treatment) HPI Comments: Patient with urticaria on her arms, torso, upper legs starting yesterday. 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 Was not using any new chemicals. No new lotions, detergents, soaps, medications. Patient was started on Nexium several weeks ago. No fevers, nausea, vomiting, fevers, angioedema, trouble swallowing, trouble breathing, abdominal pain, diarrhea. No sensation of being bitten at night, nobody else in the house with similar symptoms. Has never had a rash that this before.   ROS as noted in HPI. All other ROS negative.   Patient is a 31 y.o. female presenting with urticaria.  Urticaria This is a new problem. The current episode started yesterday. The problem occurs constantly. The problem has not changed since onset.Pertinent negatives include no chest pain, no abdominal pain, no headaches and no shortness of breath. The symptoms are aggravated by nothing. The symptoms are relieved by nothing. She has tried nothing for the symptoms.    Past Medical History  Diagnosis Date  . GERD (gastroesophageal reflux disease)     Past Surgical History  Procedure Date  . Cesarean section     No family history on file.  History  Substance Use Topics  . Smoking status: Never Smoker   . Smokeless tobacco: Not on file  . Alcohol Use: Not on file    OB History    Grav Para Term Preterm Abortions TAB SAB Ect Mult Living                  Review of Systems  Respiratory: Negative for shortness of breath.   Cardiovascular: Negative for  chest pain.  Gastrointestinal: Negative for abdominal pain.  Neurological: Negative for headaches.    Allergies  Review of patient's allergies indicates no known allergies.  Home Medications   Current Outpatient Rx  Name Route Sig Dispense Refill  . ESOMEPRAZOLE MAGNESIUM 40 MG PO CPDR Oral Take 1 capsule (40 mg total) by mouth daily. 30 capsule 3  . EPINEPHRINE 0.3 MG/0.3ML IJ DEVI Intramuscular Inject 0.3 mLs (0.3 mg total) into the muscle once. 1 Device 1  . FAMOTIDINE 20 MG PO TABS Oral Take 1 tablet (20 mg total) by mouth 2 (two) times daily. 10 tablet 0  . FEXOFENADINE HCL 60 MG PO TABS Oral Take 1 tablet (60 mg total) by mouth 2 (two) times daily. 20 tablet 0  . PREDNISONE 20 MG PO TABS Oral Take 3 tablets (60 mg total) by mouth daily. 15 tablet 0    BP 117/81  Pulse 78  Temp(Src) 97.9 F (36.6 C) (Oral)  Resp 18  SpO2 97%  LMP 06/24/2011  Physical Exam  Nursing note and vitals reviewed. Constitutional: She is oriented to person, place, and time. She appears well-developed and well-nourished. No distress.  HENT:  Head: Normocephalic and atraumatic.       Airway widely patent  Eyes: Conjunctivae and EOM are normal.  Neck: Normal range of motion.  Cardiovascular: Normal rate, regular rhythm and normal heart sounds.   Pulmonary/Chest: Effort normal.  Abdominal: Soft. She exhibits no distension.  There is no tenderness.  Musculoskeletal: Normal range of motion.  Neurological: She is alert and oriented to person, place, and time.  Skin: Skin is warm and dry. Rash noted.       Urticaria on arms, torso, legs. No rash on neck or face. No excoriations,  Psychiatric: She has a normal mood and affect. Her behavior is normal. Judgment and thought content normal.    ED Course  Procedures (including critical care time)  Labs Reviewed - No data to display No results found.   1. Urticaria       MDM  Previous records reviewed. As noted in history of present illness. He  says every involving, patient strength and. Giving prednisone 60 mg by mouth here, will send home with H2 blocker, and nonsedating antihistamine, several days of steroids, and an EpiPen. Patient will followup with occupational health, as this happened at work.   Luiz Blare, MD 07/05/11 [phone]

## 2011-07-29 ENCOUNTER — Telehealth: Payer: Self-pay | Admitting: Family Medicine

## 2011-07-29 NOTE — Telephone Encounter (Signed)
Interpreter is calling to leave a message for the patient.  The patient has been treated for Reflux, she did stop taking the abx early and then started it back and finished.  She had originally stopped taking the abx because she thought it was causing her to stay awake at night, but she was also on a steroid at that time.  The interpreter would like to discuss this with Dr. Rivka Safer.

## 2011-07-31 ENCOUNTER — Emergency Department (INDEPENDENT_AMBULATORY_CARE_PROVIDER_SITE_OTHER)
Admission: EM | Admit: 2011-07-31 | Discharge: 2011-07-31 | Disposition: A | Discharge status: Home / Self Care | Payer: 59 | Source: Home / Self Care | Attending: Family Medicine | Admitting: Family Medicine

## 2011-07-31 ENCOUNTER — Encounter (HOSPITAL_COMMUNITY): Payer: Self-pay

## 2011-07-31 DIAGNOSIS — K219 Gastro-esophageal reflux disease without esophagitis: Secondary | ICD-10-CM

## 2011-07-31 MED ORDER — ESOMEPRAZOLE MAGNESIUM 40 MG PO CPDR: 40.0000 mg | DELAYED_RELEASE_CAPSULE | Freq: Every day | ORAL | Status: DC

## 2011-07-31 MED ORDER — FAMOTIDINE 20 MG PO TABS: 20.0000 mg | ORAL_TABLET | Freq: Two times a day (BID) | ORAL | Status: DC

## 2011-07-31 MED ORDER — GI COCKTAIL ~~LOC~~
30.0000 mL | Freq: Once | ORAL | Status: AC
  Administered 2011-07-31: 30 mL via ORAL

## 2011-07-31 MED ORDER — GI COCKTAIL ~~LOC~~
ORAL | Status: AC
  Filled 2011-07-31: qty 30

## 2011-07-31 NOTE — Discharge Instructions (Signed)
Tylenol only. Clear liquids. Avoid caffeine and milk products. Chicken noodle soup, crackers and toast. Follow up with your pcp . May need GI eval. If symptoms worsen report to the local er. B?nh Tro Ng??c D? Dy Th?c Qu?n, Ng??i L?n  (Gastroesophageal Reflux Diseaes, Adult)  B?nh tro ng??c d? dy th?c qu?n (GERD) x?y ra khi axit t? d? dy tro ln th?c qu?n. Khi axit ti?p xc v?i th?c qu?n, axit gy ra ?au (vim) trong th?c qu?n. Theo th?i gian, GERD c th? t?o ra cc l? nh? (lot) ? nim m?c th?c qu?n. NGUYN NHN   Tr?ng l??ng c? th? t?ng. ?i?u ny ??t p l?c ln d? dy, lm t?ng axit t? d? dy vo th?c qu?n.   Ht thu?c l. Ht thu?c l lm t?ng l??ng axit ???c s?n sinh trong d? dy.   U?ng r??u. U?ng r??u lm gi?m p l?c trong c? th?t th?c qu?n d??i (van ho?c vnh c?a c? gi?a th?c qu?n v d? dy), cho php axit t? d? dy vo th?c qu?n.   ?n t?i mu?n v b?ng no. Tnh tr?ng ny lm t?ng p l?c c?ng nh? l??ng axit ???c s?n sinh trong d? dy.   D? t?t c? th?t th?c qu?n d??i.  ?i khi khng tm th?y nguyn nhn.  TRI?U CH?NG   ?au rt ? ph?n d??i gi?a ng?c pha sau x??ng ?c v trong khu v?c gi?a d? dy. Hi?n t??ng ny c th? x?y ra hai l?n m?t tu?n ho?c th??ng xuyn h?n.   Kh nu?t.   ?au h?ng.   Ho khan.   Cc tri?u ch?ng gi?ng hen suy?n, bao g?m t?c ng?c, th? d?c ho?c th? kh kh.  CH?N ?ON  Chuyn gia ch?m [place] y t? c th? ch?n ?on GERD d?a trn cc tri?u ch?ng c?a b?n. Trong m?t s? tr??ng h?p, ch?p X quang v cc xt nghi?m khc c th? ???c ti?n hnh ?? ki?m tra cc bi?n ch?ng ho?c tnh tr?ng c?a d? dy v th?c qu?n.  ?I?U TR?  Chuyn gia ch?m [place] y t? c th? ?? xu?t thu?c mua tr?c ti?p t?i hi?u thu?c ho?c thu?c theo toa ?? gip gi?m l??ng axit ???c s?n sinh. Hy h?i chuyn gia ch?m [place] y t? c?a b?n tr??c khi b?t ??u ho?c thm b?t k? lo?i thu?c m?i no.  H??NG D?N CH?M [place] T?I NH   Thay ??i cc y?u t? m b?n c th? ki?m sot. H?i chuyn gia ch?m  y t? ?? ???c h??ng d?n v?  vi?c gi?m cn, b? thu?c l v r??u.   [place] cc lo?i th?c ph?m v ?? u?ng lm cho cc tri?u ch?ng t?i t? h?n, ch?ng h?n nh?:   Caffeine ho?c ?? u?ng c c?n.   S c la.   B?c h ho?c v? b?c h.   T?i v hnh ty.   Th?c ?n [place].   Tri cy h? cam, ch?ng h?n nh? cam, chanh hay chanh ty.   Cc th?c ?n trn c? s? c chua, ch?ng h?n nh? n??c x?t, ?t, salsa (n??c x?t [place]) v bnh pizza.   Cc lo?i th?c ?n chin xo v nhi?u ch?t bo.   Trnh n?m xu?ng ng? 3 ti?ng tr??c gi? ?i ng? ho?c tr??c khi c m?t gi?c ng? ng?n.   ?n nh?ng b?a ?n nh?, th??ng xuyn h?n thay v cc b?a ?n l?n.   M?c qu?n o r?ng. Khng ?eo b?t c? th? g ch?t quanh th?t l?ng gy p l?c ln d? dy.  Nng ??u gi??ng cao ln t? 6 ??n 8 inch b?ng cc kh?i g? ?? gip b?n ng?. S? d?ng thm g?i s? khng c tc d?ng.   Ch? s? d?ng thu?c mua tr?c ti?p t?i hi?u thu?c ho?c thu?c theo toa ?? gi?m ?au, gi?m s? kh ch?u ho?c h? s?t theo ch? d?n c?a chuyn gia ch?m [place] y t? c?a b?n.   Khng dng thu?c atpirin, ibuprofen ho?c cc thu?c ch?ng vim khng c steroid (NSAID) khc.  HY NGAY L?P T?C THAM V?N V?I CHUYN GIA Y T? N?U:   B?n b? ?au ? cnh tay, c?, hm, r?ng ho?c l?ng.   Hi?n t??ng ?au t?ng ln ho?c thay ??i theo c??ng ?? ho?c th?i gian.   B?n b? bu?n nn, nn ho?c ?? m? hi (tot m? hi).   B?n b? th? d?c ho?c ng?t x?u.   Ch?t nn c mu xanh l cy, vng, ?en ho?c trng gi?ng nh? b c ph ho?c mu.   Phn c mu ??, ?? nh? mu ho?c ?en.  Nh?ng tri?u ch?ng ny c th? l d?u hi?u c?a cc v?n ?? khc, ch?ng h?n nh? b?nh tim, ch?y mu d? dy ho?c ch?y mu th?c qu?n.  ??M B?O B?N:   Hi?u cc h??ng d?n ny.   S? theo di tnh tr?ng c?a mnh.   S? yu c?u tr? gip ngay l?p t?c n?u b?n c?m th?y khng kh?e ho?c tnh tr?ng tr? nn t?i h?n.  Document Released: 01/28/2005 Document Revised: 04/09/2011 [facility] Patient Information 2012 [place], [place].

## 2011-07-31 NOTE — ED Notes (Addendum)
States she has been having discomfort in epigastric area w radiation into back ; denies n/v/d ro other symptoms related to her discomfort. Has previously been treated for GERD, but per chart note, stopped taking antibiotic early because she thought it was keeping her awake (also on steroids at that time) Slightly tender on palpation to lower rib/epigastric area

## 2011-07-31 NOTE — ED Provider Notes (Signed)
History     CSN: [id number]  Arrival date & time 07/31/11  1239   First MD Initiated Contact with Patient 07/31/11 1425      Chief Complaint  Patient presents with  . Heartburn    (Consider location/radiation/quality/duration/timing/severity/associated sxs/prior treatment) HPI Comments: A family member is interpreting. The patient apparently has mid epigastric abd pain for some time now. Has been treated for acid reflux and h. Pylori by [facility]cp at [facility]. Old records reviewed. No n/v//d. No cough or cold symptoms. States not affected by food. Does not awaken at night. States it does burn. Supposed to be taking nexium and pepcid.   The history is provided by the patient. A language interpreter was used.    Past Medical History  Diagnosis Date  . GERD (gastroesophageal reflux disease)     Past Surgical History  Procedure Date  . Cesarean section     History reviewed. No pertinent family history.  History  Substance Use Topics  . Smoking status: Never Smoker   . Smokeless tobacco: Not on file  . Alcohol Use: Not on file    OB History    Grav Para Term Preterm Abortions TAB SAB Ect Mult Living                  Review of Systems  Constitutional: Negative.   HENT: Negative.   Respiratory: Negative.   Cardiovascular: Negative.   Gastrointestinal: Negative for nausea, vomiting, diarrhea, constipation, blood in stool, abdominal distention, anal bleeding and rectal pain.  Genitourinary: Negative.   Musculoskeletal: Negative.   Skin: Negative.     Allergies  Review of patient's allergies indicates no known allergies.  Home Medications   Current Outpatient Rx  Name Route Sig Dispense Refill  . EPINEPHRINE 0.3 MG/0.3ML IJ DEVI Intramuscular Inject 0.3 mLs (0.3 mg total) into the muscle once. 1 Device 1  . ESOMEPRAZOLE MAGNESIUM 40 MG PO CPDR Oral Take 1 capsule (40 mg total) by mouth daily. 30 capsule 3  . FAMOTIDINE 20 MG PO TABS Oral Take 1 tablet (20 mg total) by mouth  2 (two) times daily. 10 tablet 0  . FEXOFENADINE HCL 60 MG PO TABS Oral Take 1 tablet (60 mg total) by mouth 2 (two) times daily. 20 tablet 0    BP 116/79  Pulse 66  Temp(Src) 98.4 F (36.9 C) (Oral)  Resp 18  SpO2 100%  Physical Exam  Nursing note and vitals reviewed. Constitutional: She appears well-developed and well-nourished. No distress.  Neck: Normal range of motion. Neck supple. No thyromegaly present.  Cardiovascular: Normal rate, regular rhythm and normal heart sounds.   Pulmonary/Chest: Effort normal and breath sounds normal.  Abdominal: Soft. Bowel sounds are normal. She exhibits no distension and no mass. There is no rebound and no guarding.       Slight mid epigastric tenderness  Lymphadenopathy:    She has no cervical adenopathy.  Skin: Skin is warm and dry.    ED Course  Procedures (including critical care time)  Labs Reviewed - No data to display No results found.   1. Acid reflux       MDM          Randa Spike, MD 07/31/11 [phone]

## 2011-08-03 ENCOUNTER — Emergency Department (HOSPITAL_COMMUNITY)
Admission: EM | Admit: 2011-08-03 | Discharge: 2011-08-04 | Disposition: A | Discharge status: Home / Self Care | Payer: 59 | Attending: Emergency Medicine | Admitting: Emergency Medicine

## 2011-08-03 ENCOUNTER — Emergency Department (HOSPITAL_COMMUNITY): Payer: 59

## 2011-08-03 ENCOUNTER — Other Ambulatory Visit: Payer: Self-pay

## 2011-08-03 ENCOUNTER — Encounter (HOSPITAL_COMMUNITY): Payer: Self-pay | Admitting: Emergency Medicine

## 2011-08-03 DIAGNOSIS — R1013 Epigastric pain: Secondary | ICD-10-CM | POA: Insufficient documentation

## 2011-08-03 DIAGNOSIS — R110 Nausea: Secondary | ICD-10-CM | POA: Insufficient documentation

## 2011-08-03 DIAGNOSIS — R0602 Shortness of breath: Secondary | ICD-10-CM | POA: Insufficient documentation

## 2011-08-03 DIAGNOSIS — K219 Gastro-esophageal reflux disease without esophagitis: Secondary | ICD-10-CM | POA: Insufficient documentation

## 2011-08-03 LAB — DIFFERENTIAL
Basophils Absolute: 0 10*3/uL (ref 0.0–0.1)
Lymphocytes Relative: 36 % (ref 12–46)
Lymphs Abs: 3.7 10*3/uL (ref 0.7–4.0)
Neutro Abs: 5.6 10*3/uL (ref 1.7–7.7)
Neutrophils Relative %: 55 % (ref 43–77)

## 2011-08-03 LAB — POCT I-STAT TROPONIN I: Troponin i, poc: 0.01 ng/mL (ref 0.00–0.08)

## 2011-08-03 LAB — CBC
Platelets: 205 10*3/uL (ref 150–400)
RBC: 5.32 MIL/uL — ABNORMAL HIGH (ref 3.87–5.11)
WBC: 10.2 10*3/uL (ref 4.0–10.5)

## 2011-08-03 LAB — BASIC METABOLIC PANEL
CO2: 23 mEq/L (ref 19–32)
Glucose, Bld: 80 mg/dL (ref 70–99)
Potassium: 3.3 mEq/L — ABNORMAL LOW (ref 3.5–5.1)
Sodium: 138 mEq/L (ref 135–145)

## 2011-08-03 NOTE — ED Notes (Signed)
PT. REPORTS INTERMITTENT EPIGASTRIC PAIN SINCE January 2013 WITH SLIGHT SOB , DENIES NAUSEA OR VOMITTING , NO DIARRHEA OR VAGINAL DISCHARGE . DENIES CHEST PAIN .

## 2011-08-04 ENCOUNTER — Encounter (HOSPITAL_COMMUNITY): Payer: Self-pay

## 2011-08-04 ENCOUNTER — Ambulatory Visit: Payer: Self-pay | Admitting: Family Medicine

## 2011-08-04 ENCOUNTER — Telehealth: Payer: Self-pay | Admitting: Family Medicine

## 2011-08-04 ENCOUNTER — Emergency Department (HOSPITAL_COMMUNITY): Payer: 59

## 2011-08-04 LAB — URINALYSIS, ROUTINE W REFLEX MICROSCOPIC
Glucose, UA: NEGATIVE mg/dL
Hgb urine dipstick: NEGATIVE
Leukocytes, UA: NEGATIVE
Specific Gravity, Urine: 1.012 (ref 1.005–1.030)
Urobilinogen, UA: 1 mg/dL (ref 0.0–1.0)

## 2011-08-04 LAB — HEPATIC FUNCTION PANEL
Albumin: 3.7 g/dL (ref 3.5–5.2)
Alkaline Phosphatase: 40 U/L (ref 39–117)
Total Bilirubin: 0.3 mg/dL (ref 0.3–1.2)
Total Protein: 7.2 g/dL (ref 6.0–8.3)

## 2011-08-04 LAB — LIPASE, BLOOD: Lipase: 54 U/L (ref 11–59)

## 2011-08-04 MED ORDER — SUCRALFATE 1 G PO TABS: 1.0000 g | ORAL_TABLET | Freq: Four times a day (QID) | ORAL | Status: DC

## 2011-08-04 MED ORDER — HYDROMORPHONE HCL PF 1 MG/ML IJ SOLN
INTRAMUSCULAR | Status: AC
  Filled 2011-08-04: qty 1

## 2011-08-04 MED ORDER — ONDANSETRON HCL 4 MG/2ML IJ SOLN
INTRAMUSCULAR | Status: AC
  Administered 2011-08-04: 4 mg via INTRAVENOUS
  Filled 2011-08-04: qty 2

## 2011-08-04 MED ORDER — HYDROMORPHONE HCL PF 1 MG/ML IJ SOLN
0.5000 mg | Freq: Once | INTRAMUSCULAR | Status: AC
  Administered 2011-08-04: 0.5 mg via INTRAVENOUS

## 2011-08-04 MED ORDER — OXYCODONE-ACETAMINOPHEN 5-325 MG PO TABS: 2.0000 | ORAL_TABLET | Freq: Once | ORAL | Status: DC

## 2011-08-04 MED ORDER — ONDANSETRON HCL 4 MG/2ML IJ SOLN
4.0000 mg | Freq: Once | INTRAMUSCULAR | Status: AC
  Administered 2011-08-04: 4 mg via INTRAVENOUS

## 2011-08-04 MED ORDER — OXYCODONE-ACETAMINOPHEN 5-325 MG PO TABS: 2.0000 | ORAL_TABLET | ORAL | Status: AC | PRN

## 2011-08-04 NOTE — Telephone Encounter (Signed)
[name],  She was scheduled to see Dr. Alvester Morin this am, but then due to a interpreter conflict, canceled but did not reschedule.  K

## 2011-08-04 NOTE — Discharge Instructions (Signed)
It is unclear at this time what is causing your pain.  Please follow up with your doctors at the [facility] clinic for further workup.  Continue taking your current medications.  Stick to a bland diet.  Take new medications as prescribed.  Try to take a translator with you to your next office visit or have them provide a translator to help with your care.  Return to the ER for worsening pain, vomiting, fever, or other new concerning symptoms.

## 2011-08-04 NOTE — ED Notes (Signed)
Pt c/o epigastric pain radiating into her back since 05/2011, pt denies sob, chest pain, N/V/D, pt describes her pain as a burning pain, increase burning when working.

## 2011-08-04 NOTE — Telephone Encounter (Signed)
Dr. Norlene Campbell called to inform that patient was in the ED for evaluation of presumed refractory GERD x 4 months duration. Please see note. Highly recommends patient be seen in clinic ASAP. Recommends CT of abdomen plus EGD for evaluation given chronicity of symptoms. Pt is [place] and speaks little [place]. Will need an interpreter.

## 2011-08-04 NOTE — ED Provider Notes (Signed)
History     CSN: [id number]  Arrival date & time 08/03/11  2151   First MD Initiated Contact with Patient 08/04/11 0050      Chief Complaint  Patient presents with  . Abdominal Pain    (Consider location/radiation/quality/duration/timing/severity/associated sxs/prior treatment) HPI 31 year old female presents to emergency department with intermittent upper abdominal pain ongoing for the last 3 months. Patient denies any association with eating. She has been nauseated at times but has not had any vomiting. Patient is being treated with Nexium and Pepcid for her primary care doctor at the family [facility] clinic. She had a visit at the urgent care Center 3 days ago for same. Patient reports the pain is sharp at times causing her to have shortness of breath. She denies any weight loss, fevers, diarrhea. [place] Patient currently works as a Advertising copywriter in the hospital. She reports no improvement despite the medications and eating a bland diet. No prior history of abdominal surgeries. Past Medical History  Diagnosis Date  . GERD (gastroesophageal reflux disease)     Past Surgical History  Procedure Date  . Cesarean section     History reviewed. No pertinent family history.  History  Substance Use Topics  . Smoking status: Never Smoker   . Smokeless tobacco: Not on file  . Alcohol Use: No    OB History    Grav Para Term Preterm Abortions TAB SAB Ect Mult Living                  Review of Systems  Unable to perform ROS: Other   language barrier  Allergies  Review of patient's allergies indicates no known allergies.  Home Medications   Current Outpatient Rx  Name Route Sig Dispense Refill  . ESOMEPRAZOLE MAGNESIUM 40 MG PO CPDR Oral Take 1 capsule (40 mg total) by mouth daily. 30 capsule 3  . FAMOTIDINE 20 MG PO TABS Oral Take 1 tablet (20 mg total) by mouth 2 (two) times daily. 30 tablet 0  . OXYCODONE-ACETAMINOPHEN 5-325 MG  PO TABS Oral Take 2 tablets by mouth every 4 (four) hours as needed for pain. 20 tablet 0  . SUCRALFATE 1 G PO TABS Oral Take 1 tablet (1 g total) by mouth 4 (four) times daily. 30 tablet 0    BP 99/68  Pulse 72  Temp(Src) 98.1 F (36.7 C) (Oral)  Resp 20  SpO2 97%  LMP 07/23/2011  Physical Exam  Nursing note and vitals reviewed. Constitutional: She is oriented to person, place, and time. She appears well-developed and well-nourished.  HENT:  Head: Normocephalic and atraumatic.  Nose: Nose normal.  Mouth/Throat: Oropharynx is clear and moist.  Eyes: Conjunctivae and EOM are normal. Pupils are equal, round, and reactive to light.  Neck: Normal range of motion. Neck supple. No JVD present. No tracheal deviation present. No thyromegaly present.  Cardiovascular: Normal rate, regular rhythm, normal heart sounds and intact distal pulses.  Exam reveals no gallop and no friction rub.   No murmur heard. Pulmonary/Chest: Effort normal and breath sounds normal. No stridor. No respiratory distress. She has no wheezes. She has no rales. She exhibits no tenderness.  Abdominal: Soft. Bowel sounds are normal. She exhibits no distension and no mass. There is tenderness. There is no rebound and no guarding.       Tender in the epigastrium and right upper quadrant with slight Murphy sign  Musculoskeletal: Normal range of motion. She exhibits  no edema and no tenderness.  Lymphadenopathy:    She has no cervical adenopathy.  Neurological: She is oriented to person, place, and time. She has normal reflexes. She exhibits normal muscle tone. Coordination normal.  Skin: Skin is dry. No rash noted. No erythema. No pallor.  Psychiatric: She has a normal mood and affect. Her behavior is normal. Judgment and thought content normal.    ED Course  Procedures (including critical care time)  Labs Reviewed  CBC - Abnormal; Notable for the following:    RBC 5.32 (*)    MCV 75.4 (*)    MCH 24.6 (*)    All other  components within normal limits  BASIC METABOLIC PANEL - Abnormal; Notable for the following:    Potassium 3.3 (*)    All other components within normal limits  URINALYSIS, ROUTINE W REFLEX MICROSCOPIC - Abnormal; Notable for the following:    APPearance HAZY (*)    Ketones, ur 15 (*)    All other components within normal limits  DIFFERENTIAL  POCT I-STAT TROPONIN I  POCT PREGNANCY, URINE  HEPATIC FUNCTION PANEL  LIPASE, BLOOD   Dg Chest 2 View  08/03/2011  *RADIOLOGY REPORT*  Clinical Data: Centralized chest pain.  Abdominal pain.  Nonsmoker.  CHEST - 2 VIEW  Comparison: None.  Findings: Slightly shallow inspiration.  Heart size and pulmonary vascularity are normal for technique.  No focal airspace consolidation in the lungs.  No blunting of costophrenic angles. No pneumothorax.  IMPRESSION: No evidence of active pulmonary disease.  Original Report Authenticated By: Marlon Pel, M.D.   US Abdomen Complete  08/04/2011  *RADIOLOGY REPORT*  Clinical Data:  Epigastric pain.  COMPLETE ABDOMINAL ULTRASOUND  Comparison:  CT abdomen and pelvis 03/12/2008  Findings:  Gallbladder:  No gallstones, gallbladder wall thickening, or pericholecystic fluid.  Common bile duct:  No bile duct dilatation.  Common bile duct measures 2 mm diameter.  Liver:  No focal lesion identified.  Within normal limits in parenchymal echogenicity.  IVC:  Appears normal.  Pancreas:  No focal abnormality seen.  Spleen:  Spleen length measures 6.7 cm.  Normal homogeneous parenchymal echotexture.  Right Kidney:  Right kidney measures 11.0 cm.  No hydronephrosis.  Left Kidney:  Left kidney measures 11.5 cm length.  No hydronephrosis.  Abdominal aorta:  No aneurysm identified.  IMPRESSION: Negative abdominal ultrasound.  Original Report Authenticated By: Marlon Pel, M.D.    Date: 08/04/2011  Rate: 74  Rhythm: normal sinus rhythm  QRS Axis: normal  Intervals: normal  ST/T Wave abnormalities: normal  Conduction  Disutrbances:none  Narrative Interpretation:   Old EKG Reviewed: none available   1. Epigastric pain       MDM  31 year old female with 3 months of upper abdominal pain. History and physical are slightly limited due to 2 language barrier. Patient without improvement with H2 blocker and PPI. Patient appears to have mild Murphy sign on exam, however right upper quadrant ultrasound without cholelithiasis or signs for cholecystitis. Labs are unremarkable. Discuss with on-call tonight practice resident who will have patient call a home with a translator and arrange for further workup of this ongoing pain. We'll add Carafate and Percocet to the patient's regimen. Plan was discussed with patient who appears to understand what the next step will be        Olivia Mackie, MD 08/04/11 [phone]

## 2011-08-04 NOTE — Telephone Encounter (Signed)
Please schedule this patient for an appointment.  Thank you,

## 2011-08-04 NOTE — ED Notes (Signed)
Patient transported to Ultrasound 

## 2011-08-07 ENCOUNTER — Encounter: Payer: Self-pay | Admitting: Family Medicine

## 2011-08-07 ENCOUNTER — Ambulatory Visit (INDEPENDENT_AMBULATORY_CARE_PROVIDER_SITE_OTHER): Payer: 59 | Admitting: Family Medicine

## 2011-08-07 VITALS — BP 103/74 | HR 70 | Temp 97.9°F | Ht 60.25 in | Wt 141.3 lb

## 2011-08-07 DIAGNOSIS — K2970 Gastritis, unspecified, without bleeding: Secondary | ICD-10-CM | POA: Insufficient documentation

## 2011-08-07 DIAGNOSIS — R300 Dysuria: Secondary | ICD-10-CM

## 2011-08-07 DIAGNOSIS — Z23 Encounter for immunization: Secondary | ICD-10-CM

## 2011-08-07 HISTORY — DX: Gastritis, unspecified, without bleeding: K29.70

## 2011-08-07 LAB — POCT URINALYSIS DIPSTICK
Bilirubin, UA: NEGATIVE
Blood, UA: NEGATIVE
Leukocytes, UA: NEGATIVE
Nitrite, UA: NEGATIVE
Protein, UA: NEGATIVE
Urobilinogen, UA: 0.2
pH, UA: 7

## 2011-08-07 NOTE — Progress Notes (Signed)
  Subjective:    Patient ID: [name], female    DOB: 04-13-1981, 31 y.o.   MRN: [id number]  Dysuria    1. Abdominal pain History with interpreter.  C/o burning epigastric pain since January that has worsened.  Pain radiates to back. Pain is a burning like spicy food. Patient has no food related pain. No alcohol, or drugs. No medications. No recent travel. She is [place]. She has history of GERD, but GERD ROS was negative. She has no history of pancreatitis. Her vitals were normal. She has no NSAID use. No spicy food. Hx of Hpylori positive, last one was negative. I trialed nexium 40 with her and it did not help. Normal menses without association. No bleeding.  No urinary changes.  3 days of burning with defecation.   Tobacco use: Patient is a non smoker.  No drugs No alcohol She is sexually active unprotected with partner of 10 years.   Review of Systems Pertinent items are noted in HPI.    Objective:   Physical Exam Filed Vitals:   08/07/11 1017  BP: 103/74  Pulse: 70  Temp: 97.9 F (36.6 C)  TempSrc: Oral  Height: 5' 0.25" (1.53 m)  Weight: 141 lb 4.8 oz (64.093 kg)  Abdomen: soft and non-tender without masses, organomegaly or hernias noted.  No guarding or rebound Lungs:  Normal respiratory effort, chest expands symmetrically. Lungs are clear to auscultation, no crackles or wheezes. Heart - Regular rate and rhythm.  No murmurs, gallops or rubs.    Skin:  Intact without suspicious lesions or rashes Extremities:   Non-tender, No cyanosis, edema, or deformity noted. Throat: normal mucosa, no exudate, uvula midline, no redness Nose:  External nasal examination shows no deformity or inflammation. Nasal mucosa are pink and moist without lesions or exudates. No septal dislocation or dislocation.No obstruction to airflow. Neck:  No deformities, thyromegaly, masses, or tenderness noted.   Supple with full range of motion without pain.  Back - Normal skin, Spine with normal  alignment and no deformity.  No tenderness to vertebral process palpation.  Paraspinous muscles are not tender and without spasm.   Range of motion is full at neck and lumbar sacral regions     Assessment & Plan:

## 2011-08-07 NOTE — Assessment & Plan Note (Signed)
Will refer to GI. The PPI's are not helping.  I think she needs an EGD to rule out ulcer.

## 2011-08-07 NOTE — Patient Instructions (Addendum)
It was great to see you today!  Schedule an appointment to see me for your PAP smear.  I will refer you to GI specialist for EGD.

## 2011-08-20 ENCOUNTER — Telehealth: Payer: Self-pay | Admitting: *Deleted

## 2011-08-20 NOTE — Telephone Encounter (Signed)
Received call from [place] at [facility].  Patient has an appt with Dr. Ewing Schlein on 08/26/11 @ 3:30pm.  Called patient and gave appt info.  Patient was able to repeat info back to me.  Gaylene Brooks, RN

## 2011-09-02 ENCOUNTER — Other Ambulatory Visit (HOSPITAL_COMMUNITY)
Admission: RE | Admit: 2011-09-02 | Discharge: 2011-09-02 | Disposition: A | Discharge status: Home / Self Care | Payer: 59 | Source: Ambulatory Visit | Attending: Family Medicine | Admitting: Family Medicine

## 2011-09-02 ENCOUNTER — Encounter: Payer: Self-pay | Admitting: Family Medicine

## 2011-09-02 ENCOUNTER — Ambulatory Visit (INDEPENDENT_AMBULATORY_CARE_PROVIDER_SITE_OTHER): Payer: 59 | Admitting: Family Medicine

## 2011-09-02 VITALS — BP 106/76 | HR 68 | Temp 97.0°F | Ht 60.25 in | Wt 140.5 lb

## 2011-09-02 DIAGNOSIS — Z01419 Encounter for gynecological examination (general) (routine) without abnormal findings: Secondary | ICD-10-CM | POA: Insufficient documentation

## 2011-09-02 DIAGNOSIS — Z124 Encounter for screening for malignant neoplasm of cervix: Secondary | ICD-10-CM | POA: Insufficient documentation

## 2011-09-02 HISTORY — DX: Encounter for screening for malignant neoplasm of cervix: Z12.4

## 2011-09-02 NOTE — Assessment & Plan Note (Signed)
PAP performed. Normal appearing cervix on exam.

## 2011-09-02 NOTE — Progress Notes (Signed)
  Subjective:     [name] is a 31 y.o. woman who comes in today for a  pap smear only. Her most recent Pap smear was on (unknown) Does not know if she has had any abnormal PAP's. She is from [place].   Tobacco use: Patient is a non-smoker.  Review of Systems Pertinent items are noted in HPI.   Objective:    BP 106/76  Pulse 68  Temp(Src) 97 F (36.1 C) (Oral)  Ht 5' 0.25" (1.53 m)  Wt 140 lb 8 oz (63.73 kg)  BMI 27.21 kg/m2  LMP 08/22/2011 Pelvic Exam: cervix normal in appearance, external genitalia normal and vagina normal without discharge. Pap smear obtained.   Assessment:    Screening pap smear.   Plan:    Follow up in 3 years, or as indicated by Pap results.

## 2011-09-02 NOTE — Patient Instructions (Signed)
It was great to see you today!  Schedule an appointment to see me as needed.  I hope we find out more about your abdominal pain from your EGD on the 28th.

## 2011-09-29 ENCOUNTER — Encounter (HOSPITAL_COMMUNITY)
Admission: RE | Disposition: A | Discharge status: Home / Self Care | Payer: Self-pay | Source: Ambulatory Visit | Attending: Gastroenterology

## 2011-09-29 ENCOUNTER — Ambulatory Visit (HOSPITAL_COMMUNITY)
Admission: RE | Admit: 2011-09-29 | Discharge: 2011-09-29 | Disposition: A | Discharge status: Home / Self Care | Payer: 59 | Source: Ambulatory Visit | Attending: Gastroenterology | Admitting: Gastroenterology

## 2011-09-29 ENCOUNTER — Encounter (HOSPITAL_COMMUNITY): Payer: Self-pay

## 2011-09-29 DIAGNOSIS — R109 Unspecified abdominal pain: Secondary | ICD-10-CM | POA: Insufficient documentation

## 2011-09-29 DIAGNOSIS — K2940 Chronic atrophic gastritis without bleeding: Secondary | ICD-10-CM | POA: Insufficient documentation

## 2011-09-29 HISTORY — PX: ESOPHAGOGASTRODUODENOSCOPY: SHX5428

## 2011-09-29 SURGERY — EGD (ESOPHAGOGASTRODUODENOSCOPY)
Anesthesia: Moderate Sedation

## 2011-09-29 MED ORDER — SODIUM CHLORIDE 0.9 % IV SOLN
Freq: Once | INTRAVENOUS | Status: AC
  Administered 2011-09-29: 10:00:00 via INTRAVENOUS

## 2011-09-29 MED ORDER — FENTANYL NICU IV SYRINGE 50 MCG/ML
INJECTION | INTRAMUSCULAR | Status: DC | PRN
  Administered 2011-09-29 (×2): 25 ug via INTRAVENOUS

## 2011-09-29 MED ORDER — MIDAZOLAM HCL 10 MG/2ML IJ SOLN
INTRAMUSCULAR | Status: AC
  Filled 2011-09-29: qty 2

## 2011-09-29 MED ORDER — BUTAMBEN-TETRACAINE-BENZOCAINE 2-2-14 % EX AERO
INHALATION_SPRAY | CUTANEOUS | Status: DC | PRN
  Administered 2011-09-29: 2 via TOPICAL

## 2011-09-29 MED ORDER — MIDAZOLAM HCL 10 MG/2ML IJ SOLN
INTRAMUSCULAR | Status: DC | PRN
  Administered 2011-09-29 (×2): 1 mg via INTRAVENOUS
  Administered 2011-09-29: 2 mg via INTRAVENOUS

## 2011-09-29 MED ORDER — FENTANYL CITRATE 0.05 MG/ML IJ SOLN
INTRAMUSCULAR | Status: AC
  Filled 2011-09-29: qty 2

## 2011-09-29 NOTE — Discharge Instructions (Addendum)
Call if any question or problem from the procedure but expect a little sleepiness sore throat and gas and slowly advanced the her diet today. Call for biopsy report in one week and probably set up followup in one month to recheck symptoms and decide if anything further needs to be done or if we need to try other medicines  Endoscopy Care After Please read the instructions outlined below and refer to this sheet in the next few weeks. These discharge instructions provide you with general information on caring for yourself after you leave the hospital. Your doctor may also give you specific instructions. While your treatment has been planned according to the most current medical practices available, unavoidable complications occasionally occur. If you have any problems or questions after discharge, please call your doctor. HOME CARE INSTRUCTIONS Activity  You may resume your regular activity but move at a slower pace for the next 24 hours.   Take frequent rest periods for the next 24 hours.   Walking will help expel (get rid of) the air and reduce the bloated feeling in your abdomen.   No driving for 24 hours (because of the anesthesia (medicine) used during the test).   You may shower.   Do not sign any important legal documents or operate any machinery for 24 hours (because of the anesthesia used during the test).  Nutrition  Drink plenty of fluids.   You may resume your normal diet.   Begin with a light meal and progress to your normal diet.   Avoid alcoholic beverages for 24 hours or as instructed by your caregiver.  Medications You may resume your normal medications unless your caregiver tells you otherwise. What you can expect today  You may experience abdominal discomfort such as a feeling of fullness or "gas" pains.   You may experience a sore throat for 2 to 3 days. This is normal. Gargling with salt water may help this.  Follow-up Your doctor will discuss the results of  your test with you. SEEK IMMEDIATE MEDICAL CARE IF:  You have excessive nausea (feeling sick to your stomach) and/or vomiting.   You have severe abdominal pain and distention (swelling).   You have trouble swallowing.   You have a temperature over 100 F (37.8 C).   You have rectal bleeding or vomiting of blood.  Document Released: 12/03/2003 Document Revised: 04/09/2011 Document Reviewed: 06/15/2007 [facility] Patient Information 2012 [place], [place].

## 2011-09-29 NOTE — Op Note (Signed)
[facility] [address] [place], [place]  [address]  ENDOSCOPY PROCEDURE REPORT  PATIENTSANTANA, [name]  MR#:  [id number] BIRTHDATE:  1981/01/17, 31 yrs. old  GENDER:  female  ENDOSCOPIST:  Vida Rigger, MD Referred by:  PROCEDURE DATE:  09/29/2011 PROCEDURE:  EGD with biopsy, 43239 ASA CLASS:  Class I INDICATIONS:  abdominal pain  MEDICATIONS:  50 mcg fentanyl 4 mg TOPICAL ANESTHETIC: Used  DESCRIPTION OF PROCEDURE:   After the risks benefits and alternatives of the procedure were thoroughly explained, informed consent was obtained.  The EG-[id number] ([id number]) endoscope was introduced through the mouth and advanced to the third portion of the duodenum, without limitations.  The instrument was slowly withdrawn as the mucosa was fully examined. The findings are documented below the patient tolerated the procedure well there was no obvious immediate complication <<PROCEDUREIMAGES>>  FINDINGS:  1. Questionable very short segment Barrett's esophagus of the distal esophagus status post biopsy without obvious hiatal hernia 2. Normal stomach status post biopsy to rule out Helicobacter 3. Normal duodenal bulb second and third part of the duodenum 4. Otherwise within normal limits EGD  COMPLICATIONS: None  ENDOSCOPIC IMPRESSION: Above  RECOMMENDATIONS: Await pathology consider trial of antispasmodic and consider CAT scan next if symptoms continue  REPEAT EXAM:  As needed  ______________________________ Vida Rigger, MD  CC:  n. eSIGNEDVida Rigger at 09/29/2011 10:53 AM  Alfredo Batty, [phone]

## 2011-09-30 ENCOUNTER — Encounter (HOSPITAL_COMMUNITY): Payer: Self-pay | Admitting: Gastroenterology

## 2011-10-25 ENCOUNTER — Encounter (HOSPITAL_COMMUNITY): Payer: Self-pay | Admitting: *Deleted

## 2011-10-25 ENCOUNTER — Emergency Department (HOSPITAL_COMMUNITY)
Admission: EM | Admit: 2011-10-25 | Discharge: 2011-10-25 | Disposition: A | Discharge status: Home / Self Care | Payer: 59 | Source: Home / Self Care | Attending: Emergency Medicine | Admitting: Emergency Medicine

## 2011-10-25 DIAGNOSIS — N12 Tubulo-interstitial nephritis, not specified as acute or chronic: Secondary | ICD-10-CM

## 2011-10-25 LAB — POCT URINALYSIS DIP (DEVICE)
Bilirubin Urine: NEGATIVE
Nitrite: POSITIVE — AB
Protein, ur: 100 mg/dL — AB
pH: 6.5 (ref 5.0–8.0)

## 2011-10-25 MED ORDER — PHENAZOPYRIDINE HCL 200 MG PO TABS: 200.0000 mg | ORAL_TABLET | Freq: Three times a day (TID) | ORAL | Status: AC | PRN

## 2011-10-25 MED ORDER — CEFTRIAXONE SODIUM 1 G IJ SOLR
INTRAMUSCULAR | Status: AC
  Filled 2011-10-25: qty 10

## 2011-10-25 MED ORDER — IBUPROFEN 800 MG PO TABS
800.0000 mg | ORAL_TABLET | Freq: Once | ORAL | Status: AC
  Administered 2011-10-25: 800 mg via ORAL

## 2011-10-25 MED ORDER — CEFTRIAXONE SODIUM 1 G IJ SOLR
1.0000 g | Freq: Once | INTRAMUSCULAR | Status: AC
  Administered 2011-10-25: 1 g via INTRAMUSCULAR

## 2011-10-25 MED ORDER — LIDOCAINE HCL (PF) 1 % IJ SOLN
INTRAMUSCULAR | Status: AC
  Filled 2011-10-25: qty 5

## 2011-10-25 MED ORDER — IBUPROFEN 800 MG PO TABS
ORAL_TABLET | ORAL | Status: AC
  Filled 2011-10-25: qty 1

## 2011-10-25 MED ORDER — IBUPROFEN 600 MG PO TABS: 600.0000 mg | ORAL_TABLET | Freq: Four times a day (QID) | ORAL | Status: AC | PRN

## 2011-10-25 MED ORDER — SULFAMETHOXAZOLE-TRIMETHOPRIM 800-160 MG PO TABS: 1.0000 | ORAL_TABLET | Freq: Two times a day (BID) | ORAL | Status: DC

## 2011-10-25 NOTE — ED Notes (Signed)
C/O suprapubic pain and fever since yesterday.  Has had chills, skin very warm to touch.  Has not taken any meds.  Admits to low back pain.  C/O increased abdominal pain with urination.  Denies urinary frequency.  Had emesis x 1.  Able to keep down some fluids.  Tender over suprapubic area.  Denies RLQ pain with palpation.  Last BM yesterday - was normal.

## 2011-10-25 NOTE — ED Provider Notes (Signed)
History     CSN: [id number]  Arrival date & time 10/25/11  1122   First MD Initiated Contact with Patient 10/25/11 1126      Chief Complaint  Patient presents with  . Abdominal Pain  . Fever    (Consider location/radiation/quality/duration/timing/severity/associated sxs/prior treatment) HPI Comments: Patient reports chills, feeling feverish, lower, nonradiating, midline abdominal pain which is worse after urinating. Also complains of bilateral flank pain. Reports nausea, vomiting times one yesterday. Is tolerating by mouth today. No vaginal bleeding, discharge. No aggravating or alleviating factors. She has not taken anything for this. States this feels identical to previous UTIs.  ROS as noted in HPI. All other ROS negative.    Patient is a 31 y.o. female presenting with abdominal pain and fever. The history is provided by the patient. The history is limited by a language barrier. A language interpreter was used.  Abdominal Pain The primary symptoms of the illness include abdominal pain, fever, vomiting and dysuria. The primary symptoms of the illness do not include shortness of breath, diarrhea, hematochezia, vaginal discharge or vaginal bleeding. The current episode started yesterday. The onset of the illness was sudden.  The dysuria is not associated with hematuria, frequency or urgency.  The patient states that she believes she is currently not pregnant. The patient has not had a change in bowel habit. Additional symptoms associated with the illness include chills and back pain. Symptoms associated with the illness do not include constipation, urgency, hematuria or frequency.  Fever Primary symptoms of the febrile illness include fever, abdominal pain, vomiting and dysuria. Primary symptoms do not include shortness of breath or diarrhea.  The dysuria is not associated with hematuria, frequency or urgency.    Past Medical History  Diagnosis Date  . GERD (gastroesophageal reflux  disease)   . Pap smear for cervical cancer screening 09/02/2011    Past Surgical History  Procedure Date  . Cesarean section   . Esophagogastroduodenoscopy 09/29/2011    Procedure: ESOPHAGOGASTRODUODENOSCOPY (EGD);  Surgeon: Petra Kuba, MD;  Location: [facility];  Service: Endoscopy;  Laterality: N/A;    No family history on file.  History  Substance Use Topics  . Smoking status: Never Smoker   . Smokeless tobacco: Not on file  . Alcohol Use: No    OB History    Grav Para Term Preterm Abortions TAB SAB Ect Mult Living                  Review of Systems  Constitutional: Positive for fever and chills.  Respiratory: Negative for shortness of breath.   Gastrointestinal: Positive for vomiting and abdominal pain. Negative for diarrhea, constipation and hematochezia.  Genitourinary: Positive for dysuria. Negative for urgency, frequency, hematuria, vaginal bleeding and vaginal discharge.  Musculoskeletal: Positive for back pain.    Allergies  Review of patient's allergies indicates no known allergies.  Home Medications   Current Outpatient Rx  Name Route Sig Dispense Refill  . ESOMEPRAZOLE MAGNESIUM 40 MG PO CPDR Oral Take 1 capsule (40 mg total) by mouth daily. 30 capsule 3  . FAMOTIDINE 20 MG PO TABS Oral Take 1 tablet (20 mg total) by mouth 2 (two) times daily. 30 tablet 0  . IBUPROFEN 600 MG PO TABS Oral Take 1 tablet (600 mg total) by mouth every 6 (six) hours as needed for pain. 30 tablet 0  . NORETHINDRON-ETHINYL ESTRAD-FE 1-20/1-30/1-35 MG-MCG PO TABS Oral Take 1 tablet by mouth daily.    Marland Kitchen PHENAZOPYRIDINE HCL 200 MG  PO TABS Oral Take 1 tablet (200 mg total) by mouth 3 (three) times daily as needed for pain. 6 tablet 0  . SUCRALFATE 1 G PO TABS Oral Take 1 tablet (1 g total) by mouth 4 (four) times daily. 30 tablet 0  . SULFAMETHOXAZOLE-TRIMETHOPRIM 800-160 MG PO TABS Oral Take 1 tablet by mouth 2 (two) times daily. X 14 days 28 tablet 0    BP 106/63  Pulse 106   Temp 101.1 F (38.4 C) (Oral)  Resp 23  SpO2 98%  LMP 10/15/2011  Physical Exam  Nursing note and vitals reviewed. Constitutional: She is oriented to person, place, and time. She appears well-developed and well-nourished.  HENT:  Head: Normocephalic and atraumatic.  Eyes: Conjunctivae and EOM are normal.  Neck: Normal range of motion.  Cardiovascular: Normal rate, regular rhythm and normal heart sounds.   No murmur heard. Pulmonary/Chest: Effort normal and breath sounds normal.  Abdominal: Soft. Normal appearance and bowel sounds are normal. She exhibits no distension and no mass. There is tenderness in the suprapubic area. There is no rebound, no guarding, no CVA tenderness, no tenderness at McBurney's point and negative Murphy's sign.  Musculoskeletal: Normal range of motion.  Neurological: She is alert and oriented to person, place, and time.  Skin: Skin is warm and dry. No rash noted.  Psychiatric: She has a normal mood and affect. Her behavior is normal. Judgment and thought content normal.    ED Course  Procedures (including critical care time)  Labs Reviewed  POCT URINALYSIS DIP (DEVICE) - Abnormal; Notable for the following:    Ketones, ur >=160 (*)     Hgb urine dipstick MODERATE (*)     Protein, ur 100 (*)     Nitrite POSITIVE (*)     Leukocytes, UA LARGE (*)  Biochemical Testing Only. Please order routine urinalysis from main lab if confirmatory testing is needed.   All other components within normal limits  POCT PREGNANCY, URINE  URINE CULTURE   No results found.   1. Pyelonephritis      Results for orders placed during the hospital encounter of 10/25/11  POCT URINALYSIS DIP (DEVICE)      Component Value Range   Glucose, UA NEGATIVE  NEGATIVE mg/dL   Bilirubin Urine NEGATIVE  NEGATIVE   Ketones, ur >=160 (*) NEGATIVE mg/dL   Specific Gravity, Urine 1.010  1.005 - 1.030   Hgb urine dipstick MODERATE (*) NEGATIVE   pH 6.5  5.0 - 8.0   Protein, ur 100 (*)  NEGATIVE mg/dL   Urobilinogen, UA 1.0  0.0 - 1.0 mg/dL   Nitrite POSITIVE (*) NEGATIVE   Leukocytes, UA LARGE (*) NEGATIVE  POCT PREGNANCY, URINE      Component Value Range   Preg Test, Ur NEGATIVE  NEGATIVE    MDM  H&P most consistent with  pyelonephritis. Giving 1 g of Rocephin here, sending home on Bactrim 1 tab twice a day for 14 days. Sending urine off for culture. Discussed symptoms that should prompt return.  Luiz Blare, MD 10/25/11 [phone]

## 2011-10-25 NOTE — ED Notes (Signed)
Patient and daughter educated on S/S allergic reaction - instructed to notify staff immediately if any problems.  Pt states chills have gone away now.

## 2011-10-25 NOTE — Discharge Instructions (Signed)
Make sure you give [place] a working phone number so that we can contact you if we need to change her antibiotic. Make sure you finish all the antibiotics, even if you feel better. Drink plenty of fluids. Return to the ED if you have a persistent fever above 100.4, if you get worse after being on antibiotics for 24 hours, or other concerns.

## 2011-10-27 LAB — URINE CULTURE

## 2011-10-28 ENCOUNTER — Encounter: Payer: Self-pay | Admitting: Family Medicine

## 2011-10-28 ENCOUNTER — Ambulatory Visit (INDEPENDENT_AMBULATORY_CARE_PROVIDER_SITE_OTHER): Payer: 59 | Admitting: Family Medicine

## 2011-10-28 ENCOUNTER — Ambulatory Visit: Payer: Self-pay | Admitting: Family Medicine

## 2011-10-28 VITALS — BP 96/67 | HR 85 | Temp 98.2°F | Ht 62.0 in | Wt 131.0 lb

## 2011-10-28 DIAGNOSIS — N12 Tubulo-interstitial nephritis, not specified as acute or chronic: Secondary | ICD-10-CM

## 2011-10-28 DIAGNOSIS — N10 Acute tubulo-interstitial nephritis: Secondary | ICD-10-CM | POA: Insufficient documentation

## 2011-10-28 DIAGNOSIS — R300 Dysuria: Secondary | ICD-10-CM

## 2011-10-28 HISTORY — DX: Acute pyelonephritis: N10

## 2011-10-28 LAB — POCT UA - MICROSCOPIC ONLY

## 2011-10-28 LAB — CBC WITH DIFFERENTIAL/PLATELET
Basophils Absolute: 0 10*3/uL (ref 0.0–0.1)
Lymphocytes Relative: 20 % (ref 12–46)
Neutro Abs: 3.9 10*3/uL (ref 1.7–7.7)
Platelets: 165 10*3/uL (ref 150–400)
RDW: 14.6 % (ref 11.5–15.5)
WBC: 6.7 10*3/uL (ref 4.0–10.5)

## 2011-10-28 LAB — POCT URINALYSIS DIPSTICK: Spec Grav, UA: 1.015

## 2011-10-28 LAB — BASIC METABOLIC PANEL
BUN: 10 mg/dL (ref 6–23)
Calcium: 8.8 mg/dL (ref 8.4–10.5)
Glucose, Bld: 76 mg/dL (ref 70–99)

## 2011-10-28 MED ORDER — PROMETHAZINE HCL 12.5 MG PO TABS: 12.5000 mg | ORAL_TABLET | Freq: Three times a day (TID) | ORAL | Status: DC | PRN

## 2011-10-28 MED ORDER — CEFTRIAXONE SODIUM 1 G IJ SOLR
1.0000 g | Freq: Once | INTRAMUSCULAR | Status: AC
  Administered 2011-10-28: 1 g via INTRAMUSCULAR

## 2011-10-28 MED ORDER — CEPHALEXIN 500 MG PO CAPS: 500.0000 mg | ORAL_CAPSULE | Freq: Three times a day (TID) | ORAL | Status: AC

## 2011-10-28 NOTE — ED Notes (Signed)
Urine culture: >100,000 colonies E. Coli. Pt. treated with Rocephin 1 gm. IM  which is sensitive and a Rx. of  Septra DS, that is  resistant.  Chart reviewed and it appears that [facility] gave pt. another shot of Rocephin 1 gm on 6/26 and changed pt.'s antibiotic to Keflex.  [name] 10/28/2011

## 2011-10-28 NOTE — Patient Instructions (Addendum)
Today I am giving you an antibiotic shot I would like you to start the Keflex tomorrow morning Please take small sips of fluid throughout the day to try to stay hydrated I would like you to return for recheck tomorrow afternoon If you get worse tonight, please be seen sooner

## 2011-10-29 ENCOUNTER — Encounter: Payer: Self-pay | Admitting: Family Medicine

## 2011-10-29 NOTE — Progress Notes (Signed)
  Subjective:    Patient ID: [name], female    DOB: Jan 04, 1981, 30 y.o.   MRN: [id number]  HPI  She was seen in urgent care and diagnosed with pyelonephritis. She started on a 14 day course of Bactrim. She does not feel any better today. She is still throwing up and is having trouble keeping down the antibiotic. She is able to drink a little bit of water. She is still having some mid back pain. She had a fever yesterday. She is not taking anything for fever nausea. She denies abdominal pain or chest pain. She does not have cough. She is not having diarrhea.  Patient is a recent immigrant and has her sponsor with her. Her sponsor is a Engineer, civil (consulting) and feels that she can check in on her tomorrow with a return on Friday. She is aware that if there is any worsening, she is to bring her in before Friday.  Review of Systems See above    Objective:   Physical Exam GEN-diaphoretic, uncomfortable appearing moist mucous membranes Heart - normal rate, regular rhythm, normal S1, S2, no murmurs, rubs, clicks or gallops Chest - clear to auscultation, no wheezes, rales or rhonchi, symmetric air entry, no tachypnea, retractions or cyanosis Abdomen - soft, nontender, nondistended, no masses or organomegaly Extremities - peripheral pulses normal, no pedal edema, no clubbing or cyanosis        Assessment & Plan:

## 2011-10-29 NOTE — Assessment & Plan Note (Signed)
Given culture results, we'll switch from Bactrim to Keflex. She appears hydrated but uncomfortable. Will give ceftriaxone today and start Keflex tomorrow. If patient unable to tolerate by mouth, she is to return before Friday otherwise see back on Friday to make sure she is improving.

## 2011-10-30 ENCOUNTER — Ambulatory Visit: Payer: Self-pay | Admitting: Family Medicine

## 2011-11-04 ENCOUNTER — Telehealth: Payer: Self-pay | Admitting: Family Medicine

## 2011-11-04 NOTE — Telephone Encounter (Signed)
FMLA form placed in Dr. Donnetta Hail box.  Gaylene Brooks, RN

## 2011-11-04 NOTE — Telephone Encounter (Signed)
Patient dropped off FMLA forms to be filled out.  Please call her when completed. °

## 2011-11-06 NOTE — Telephone Encounter (Signed)
Patient notified that form is ready to pick up. 

## 2012-06-06 ENCOUNTER — Ambulatory Visit (INDEPENDENT_AMBULATORY_CARE_PROVIDER_SITE_OTHER): Payer: 59 | Admitting: Family Medicine

## 2012-06-06 ENCOUNTER — Encounter: Payer: Self-pay | Admitting: Family Medicine

## 2012-06-06 VITALS — BP 104/75 | HR 77 | Temp 98.6°F | Ht 62.0 in | Wt 139.0 lb

## 2012-06-06 DIAGNOSIS — Z23 Encounter for immunization: Secondary | ICD-10-CM

## 2012-06-06 DIAGNOSIS — K2970 Gastritis, unspecified, without bleeding: Secondary | ICD-10-CM

## 2012-06-06 DIAGNOSIS — Z3041 Encounter for surveillance of contraceptive pills: Secondary | ICD-10-CM

## 2012-06-06 DIAGNOSIS — Z3009 Encounter for other general counseling and advice on contraception: Secondary | ICD-10-CM | POA: Insufficient documentation

## 2012-06-06 DIAGNOSIS — K219 Gastro-esophageal reflux disease without esophagitis: Secondary | ICD-10-CM

## 2012-06-06 MED ORDER — FAMOTIDINE 40 MG PO TABS: 20.0000 mg | ORAL_TABLET | Freq: Two times a day (BID) | ORAL | Status: DC

## 2012-06-06 MED ORDER — NORETHINDRON-ETHINYL ESTRAD-FE 1-20/1-30/1-35 MG-MCG PO TABS: 1.0000 | ORAL_TABLET | Freq: Every day | ORAL | Status: DC

## 2012-06-06 MED ORDER — FAMOTIDINE 20 MG PO TABS: 20.0000 mg | ORAL_TABLET | Freq: Two times a day (BID) | ORAL | Status: DC

## 2012-06-06 MED ORDER — ESOMEPRAZOLE MAGNESIUM 40 MG PO CPDR: 40.0000 mg | DELAYED_RELEASE_CAPSULE | Freq: Every day | ORAL | Status: DC

## 2012-06-06 NOTE — Progress Notes (Signed)
Subjective:     Patient ID: [name], female   DOB: 1980/08/29, 32 y.o.   MRN: [id number]  HPI Annual Physical: GERD: Patient presents to the office today, with interpreter, for her annual physical exam. Her only complaint is her GERD pain. She reports she feels pain in her epigastric region with spicy foods. She has run out of her medications, otherwise it is controlled except with consumption of hot pepper. 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  Birth control management: Her main concern today is refill of her birth control pills. She is a G2P2 and does not desire anymore children. Her LMP occurred on 05/16/2012. Her cycles are regular on BCP, with a duration of 4 days. She denies irregular or heavy bleeding during her cycles. Her last PAP was was March of 2013 and was WNL. Patient voices interest in another form of birth control, and asks about a BTL. After in depth discussion about BTL, IUD and depo provera, patient decided to stay on the pill.   Constipation: Patient reports she has occasional difficulties with constipation that produces mild abdominal pain. She denies bloody stools, diarrhea or fever. She noticed the constipation became more of a problem after she started her medication for GERD. She request guidance on what she can take to help her relieve her constipation.    Review of Systems  Constitutional: Negative for fever, fatigue and unexpected weight change.  All other systems reviewed and are negative.      Objective:   Physical Exam  Constitutional: She is oriented to person, place, and time. She appears well-developed and well-nourished. No distress.  HENT:  Head: Normocephalic and atraumatic.  Right Ear: External ear normal.  Left Ear: External ear normal.  Nose: Nose normal.  Mouth/Throat: Oropharynx is clear and moist.  Eyes: EOM are normal. Pupils are equal, round, and reactive to light. Right eye exhibits no discharge. Left eye  exhibits no discharge. No scleral icterus.       Bilateral injection of eyes.   Neck: Normal range of motion. Neck supple. No tracheal deviation present. No thyromegaly present.  Cardiovascular: Normal rate, regular rhythm, normal heart sounds and intact distal pulses.   No murmur heard. Pulmonary/Chest: Effort normal and breath sounds normal. No respiratory distress.  Abdominal: Soft. Bowel sounds are normal. She exhibits no distension and no mass. There is tenderness in the left lower quadrant. There is no rebound and no guarding.  Musculoskeletal: Normal range of motion. She exhibits no edema and no tenderness.  Lymphadenopathy:    She has no cervical adenopathy.  Neurological: She is alert and oriented to person, place, and time. She has normal reflexes. No cranial nerve deficit. Coordination normal.  Skin: Skin is warm and dry.  Psychiatric: She has a normal mood and affect.   BP 104/75  Pulse 77  Temp 98.6 F (37 C) (Oral)  Ht 5\' 2"  (1.575 m)  Wt 139 lb (63.05 kg)  BMI 25.42 kg/m2  LMP 05/16/2012

## 2012-06-06 NOTE — Assessment & Plan Note (Addendum)
-   Medications are effective when taken correctly and spicy foods are avoided. - Patient currently on Nexium 40 and Pepcid 20 BID - Refill of medications sent to pharmacy today  - Avoidance of NSAIDS and Spicy food triggers recommended, patient voiced understanding through interpreter.  - AVS information on peptic ulcers give for interpreter and patient to review.  - F/U: PRN

## 2012-06-06 NOTE — Assessment & Plan Note (Signed)
-   Patient is a G2P2, desires method of BC. - 1 year refill given today. Patient currently on estrostep FE and tolerating medication well.  - Next PAP 09/2013 unless otherwise needed.

## 2012-06-06 NOTE — Assessment & Plan Note (Signed)
-   Reviewed BTL, IUD, Depo Provera and BCP today with patient. She voiced interest in a BTL, until she realized it was a surgical procedure. She does not desire the IUD or the DEPO after review of each method in detail. She has decided to remain on  BCP. - Last PAP was 5/13 and was WNL, 3 year schedule; unless a complication arises.  - BCP refill for 1 year today.

## 2012-06-06 NOTE — Patient Instructions (Signed)
It was a pleasure to meet you today. I have refilled your medications and birth control pills. For you constipation I recommend Miralax daily (1/2 cap) until your bowel movements regulate Peptic Ulcers Ulcers are small, open, and painful sores that form in the lining of the stomach (gastric ulcers). They can also form in the first part of the small intestine (duodenal ulcers). A peptic ulcer describes both types of ulcers. Ulcers often heal by taking medicine or changing what you eat. Surgery is only needed if the pain cannot be controlled or if tearing, blockage, or bleeding is found. HOME CARE  Stop smoking.  Avoid alcohol.  Avoid aspirin and drugs that lessen puffiness (swelling) and soreness.  Eat regular, healthy meals.  Avoid foods that bother you.  Only take medicine as told by your doctor.  Always ask your doctor first before switching brands of antacid medicine. GET HELP RIGHT AWAY IF:  You have bright red blood in your throw up (vomit).  You have bloody, tarry, or black looking poop (stool).  You feel weak, tired, or pass out (faint).  You have sudden belly (abdominal) pain that hurts really bad.  You have bad pain and keep throwing up. MAKE SURE YOU:   Understand these instructions.  Will watch your condition.  Will get help right away if you are not doing well or get worse. Document Released: 07/15/2009 Document Revised: 07/13/2011 Document Reviewed: 07/15/2009 [facility] Patient Information 2013 [place], [place].

## 2012-07-27 ENCOUNTER — Telehealth: Payer: Self-pay | Admitting: Family Medicine

## 2012-07-27 NOTE — Telephone Encounter (Signed)
Pt dropped off FMLA paperwork to be filled out

## 2012-08-10 NOTE — Telephone Encounter (Signed)
After speaking with Lynnea Ferrier she too had difficulty relating message to patient,pt unable to speak [place] patient,[name] requested an interpretor to call patient to explain paperwork couldn't be filled because this paperwork was on a person that wasn't a patient here. Arlynn Mcdermid, Virgel Bouquet   r

## 2012-08-10 NOTE — Telephone Encounter (Signed)
These papers were given back to [place] last week and she was going to contact the patient. These papers are not for her (condition), as the patient, they are for a relative of hers that she is caring for... The relative (with the condition) is not my patient or even seen at this clinic. I believe after asking a few providers, she needs the relative's provider to fill out those papers. Please see Lyla Son (front desk), she had the papers.

## 2012-08-10 NOTE — Telephone Encounter (Signed)
Pt is asking for status of her FMLA papers - pls call her

## 2012-08-11 ENCOUNTER — Telehealth: Payer: Self-pay | Admitting: *Deleted

## 2012-08-11 NOTE — Telephone Encounter (Signed)
Dr Claiborne Billings after speaking with Patrcia Dolly [employer] FMLA  Needs to be completed by patients father's physician in [place].pt was informed. [name], Virgel Bouquet

## 2012-12-07 ENCOUNTER — Telehealth: Payer: Self-pay | Admitting: *Deleted

## 2012-12-07 NOTE — Telephone Encounter (Signed)
Pt's daughter called and left message on pharmacy/physician line requesting refill on OCP.  I instructed daughter to call her pharmacy and request a refill since pt still has refills left on Rx.  Daughter verbalized understanding.  I called daughter back later to make sure she was able to get her Rx and daughter said they picked it up already.  Nalu Troublefield, Darlyne Russian, CMA

## 2013-01-05 ENCOUNTER — Ambulatory Visit (INDEPENDENT_AMBULATORY_CARE_PROVIDER_SITE_OTHER): Payer: 59 | Admitting: Family Medicine

## 2013-01-05 ENCOUNTER — Encounter: Payer: Self-pay | Admitting: Family Medicine

## 2013-01-05 VITALS — BP 93/59 | HR 108 | Temp 100.8°F | Wt 141.0 lb

## 2013-01-05 DIAGNOSIS — K5900 Constipation, unspecified: Secondary | ICD-10-CM

## 2013-01-05 DIAGNOSIS — N390 Urinary tract infection, site not specified: Secondary | ICD-10-CM

## 2013-01-05 DIAGNOSIS — R300 Dysuria: Secondary | ICD-10-CM

## 2013-01-05 LAB — BASIC METABOLIC PANEL
BUN: 7 mg/dL (ref 6–23)
CO2: 24 mEq/L (ref 19–32)
Chloride: 99 mEq/L (ref 96–112)
Creat: 0.76 mg/dL (ref 0.50–1.10)
Glucose, Bld: 118 mg/dL — ABNORMAL HIGH (ref 70–99)

## 2013-01-05 LAB — POCT URINALYSIS DIPSTICK
Ketones, UA: NEGATIVE
Protein, UA: 30
Spec Grav, UA: 1.015

## 2013-01-05 LAB — POCT UA - MICROSCOPIC ONLY

## 2013-01-05 MED ORDER — CEFTRIAXONE SODIUM 1 G IJ SOLR: 1.0000 g | Freq: Once | INTRAMUSCULAR | Status: DC

## 2013-01-05 MED ORDER — CEFTRIAXONE SODIUM 1 G IJ SOLR
1.0000 g | Freq: Once | INTRAMUSCULAR | Status: AC
  Administered 2013-01-05: 1 g via INTRAMUSCULAR

## 2013-01-05 MED ORDER — CEPHALEXIN 500 MG PO CAPS: 500.0000 mg | ORAL_CAPSULE | Freq: Three times a day (TID) | ORAL | Status: DC

## 2013-01-05 MED ORDER — POLYETHYLENE GLYCOL 3350 17 GM/SCOOP PO POWD: 17.0000 g | Freq: Every day | ORAL | Status: DC

## 2013-01-05 MED ORDER — PSYLLIUM 28 % PO PACK: 1.0000 | PACK | Freq: Every day | ORAL | Status: DC

## 2013-01-05 NOTE — Patient Instructions (Signed)
Cierrah I am sorry that you are not feeling well! I think that you may have a urinary tract infection.  I am prescribing an antibiotic to help kill the bacteria. You should drink plenty of water You can also keep taking motrin and tylenol to help with pain and fever I would like to see you back here in 1 week to reassess how you feel  If you begin to have more pain in your back, If you do not seem to be getting better, If you see blood in your urine, or you continue to have fevers above 100.4 please call the office immediately  Charlane Ferretti, MD

## 2013-01-05 NOTE — Progress Notes (Signed)
Patient ID: [name], female   DOB: 1980/12/18, 32 y.o.   MRN: [id number] [name] Olivia Ferretti, MD Phone: [phone]  Subjective:  [name] is a 32 y.o F with hx of pyelo here for pain with urination on same day appointment  # Dysuria -Subjective fevers at home. Patient feels chilled. Wearing multiple blankets. No hematuria. Some lower back pain as well. Has taken advil and tylenol (last dose 6am). Also with headache. No SOB cough congestion  All systems were reviewed and were negative unless otherwise noted in the HPI  Past Medical History Patient Active Problem List   Diagnosis Date Noted  . Family planning, BCP (birth control pills) maintenance 06/06/2012  . Birth control counseling 06/06/2012  . Pyelonephritis 10/28/2011  . Pap smear for cervical cancer screening 09/02/2011  . Gastritis 08/07/2011  . Gastric ulcer 06/25/2011  . Reflux 04/15/2011  . SPONDYLOLISTHESIS 03/26/2010  . BACK PAIN, LUMBAR 03/20/2010   Reviewed problem list.  Medications- reviewed and updated Chief complaint-noted No additions to family history Social history- patient is a non smoker  Objective: BP 93/59  Pulse 108  Temp(Src) 100.8 F (38.2 C)  Wt 141 lb (63.957 kg)  BMI 25.78 kg/m2 Gen: NAD, alert, cooperative with exam Abd: SNTND, BS present, no guarding or organomegaly Pulm: CTAB Back: flank tenderness, would not describe as CVA  Ext: No edema, warm, normal tone, moves UE/LE spontaneously Neuro: Alert and oriented, No gross deficits Skin: no rashes no lesions  Results for Olivia, Avila (MRN [id number]) as of 01/05/2013 17:03  Ref. Range 01/05/2013 15:05  Color, UA No range found YELLOW  Specific Gravity, UA No range found 1.015  pH, UA No range found 7.0  Glucose Latest Range: NEGATIVE mg/dL NEG  Bilirubin, UA No range found NEG  Ketones, UA No range found NEG  Clarity, UA No range found CLEAR  Protein, UA No range found 30  Urobilinogen, UA Latest  Range: 0.0-1.0 mg/dL 4.0  Nitrite, UA No range found NEG  Leukocytes, UA No range found moderate (2+)  RBC, UA No range found MODERATE  Epithelial cells, urine per micros No range found OCC  WBC, Ur, HPF, POC No range found TNTC  Bacteria, U Microscopic No range found 1+  RBC, urine, microscopic No range found 10-15   Assessment/Plan: UTI with possible early pyelo given hx (past and current presentation) -1gm rocephin here -keflex 500mg  TID -BMET, CBC -REHYRATION- tachy with hypotension -RTC for worsening back back pain, gross hematuria confusion -back here in 1 week regardless -note given for work

## 2013-01-06 ENCOUNTER — Telehealth: Payer: Self-pay | Admitting: Family Medicine

## 2013-01-06 ENCOUNTER — Ambulatory Visit: Payer: Self-pay

## 2013-01-06 ENCOUNTER — Encounter: Payer: Self-pay | Admitting: Family Medicine

## 2013-01-06 DIAGNOSIS — E876 Hypokalemia: Secondary | ICD-10-CM

## 2013-01-06 LAB — CBC WITH DIFFERENTIAL/PLATELET
Basophils Absolute: 0 10*3/uL (ref 0.0–0.1)
Eosinophils Relative: 0 % (ref 0–5)
HCT: 36.6 % (ref 36.0–46.0)
Lymphocytes Relative: 6 % — ABNORMAL LOW (ref 12–46)
Lymphs Abs: 0.9 10*3/uL (ref 0.7–4.0)
MCV: 74.4 fL — ABNORMAL LOW (ref 78.0–100.0)
Monocytes Absolute: 1.5 10*3/uL — ABNORMAL HIGH (ref 0.1–1.0)
Monocytes Relative: 9 % (ref 3–12)
RDW: 14.8 % (ref 11.5–15.5)
WBC: 16.9 10*3/uL — ABNORMAL HIGH (ref 4.0–10.5)

## 2013-01-06 MED ORDER — POTASSIUM CHLORIDE CRYS ER 20 MEQ PO TBCR: 20.0000 meq | EXTENDED_RELEASE_TABLET | Freq: Every day | ORAL | Status: DC

## 2013-01-06 NOTE — Telephone Encounter (Signed)
Attempted to give pt call regarding recent blood work. Was not able to leave message. Patient should begin taking potassium supplement for the next 10 days as well as continue to drink a good amount of liquids to stay hydrated.

## 2013-01-18 ENCOUNTER — Other Ambulatory Visit: Payer: Self-pay | Admitting: Obstetrics and Gynecology

## 2013-01-30 ENCOUNTER — Other Ambulatory Visit: Payer: Self-pay | Admitting: Obstetrics and Gynecology

## 2013-01-30 NOTE — H&P (Addendum)
  [name] is a 32 y.o.  female, P2-0-0-2 for tubal sterilization with bilateral salpingectomy.   Has used birth control pills in the past but wants to no longer have children. (Accompanied by advocate-Cindy however, she does not speak the patient's language of [place],  [employer] accessed but no interpreter available in her language)  Patient gave permission for interpreter, Jobe Gibbon (formerly of [employer] for refugees). Patient desires to no longer be able to have children. Has used birth control pills in the past (and currently).  Her menses are very light lasting for 3 days with pad change 3 times a day and no cramps.  Past Medical History  OB History: G: 2;  P 2-0-0-2   C-section in 2002 and 2009  GYN History: menarche: 32 YO;  LMP: 12/30/12;  Contracepton oral contraceptives (estrogen/progesterone)  The patient denies history of sexually transmitted disease.  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  No Known Allergies  ROS: Denies glasses, dentures,  headache, vision changes, nasal congestion, dysphagia, tinnitus, dizziness, hoarseness, cough,  chest pain, shortness of breath, nausea, vomiting, diarrhea,constipation,  urinary frequency, urgency  dysuria, hematuria, vaginitis symptoms, pelvic pain, swelling of joints,easy bruising,  myalgias, arthralgias, skin rashes, unexplained weight loss and except as is mentioned in the history of present illness, patient's review of systems is otherwise negative.  Physical Exam  Bp:  94/62    P:64   R: 12   Weight: 137 lbs.   Height: 5'7.5"     BMI: 21.1   Temperature: 98.1 degrees F orally  Neck: supple without masses or thyromegaly Lungs: clear to auscultation Heart: regular rate and rhythm Abdomen: soft, non-tender and no organomegaly Pelvic:EGBUS- wnl; vagina-normal rugae; uterus-normal size, cervix without lesions or motion tenderness; adnexae-no tenderness or masses Extremities:  no clubbing, cyanosis or edema   Assesment:  Desire for Permanent Sterilization   Disposition:  A discussion was held with patient with interpreter,  Jobe Gibbon,  regarding the indication for her procedure(s) along with the risks, which include but are not limited to: reaction to anesthesia, damage to adjacent organs, infection,  excessive bleeding and risk of pregnancy (2 in 500).  The patient verbalized understanding of these risks and had an opportunity to have her questions answered.  She has consented to having Bilateral Salpingectomy for Sterilization at [facility] [place] on February 13, 2013 at 1:30 p.m.   CSN# [id number]   Davian Hanshaw J. Lowell Guitar, PA-C  for Dr. Woodroe Mode. Su Hilt

## 2013-02-09 ENCOUNTER — Inpatient Hospital Stay (HOSPITAL_COMMUNITY): Admission: RE | Admit: 2013-02-09 | Payer: Self-pay | Source: Ambulatory Visit

## 2013-02-13 ENCOUNTER — Encounter (HOSPITAL_COMMUNITY): Payer: Self-pay | Admitting: *Deleted

## 2013-02-13 ENCOUNTER — Encounter (HOSPITAL_COMMUNITY)
Admission: RE | Disposition: A | Discharge status: Home / Self Care | Payer: Self-pay | Source: Ambulatory Visit | Attending: Obstetrics and Gynecology

## 2013-02-13 ENCOUNTER — Encounter (HOSPITAL_COMMUNITY): Payer: 59 | Admitting: Anesthesiology

## 2013-02-13 ENCOUNTER — Ambulatory Visit (HOSPITAL_COMMUNITY)
Admission: RE | Admit: 2013-02-13 | Discharge: 2013-02-13 | Disposition: A | Discharge status: Home / Self Care | Payer: 59 | Source: Ambulatory Visit | Attending: Obstetrics and Gynecology | Admitting: Obstetrics and Gynecology

## 2013-02-13 ENCOUNTER — Ambulatory Visit (HOSPITAL_COMMUNITY): Payer: 59 | Admitting: Anesthesiology

## 2013-02-13 DIAGNOSIS — Z302 Encounter for sterilization: Secondary | ICD-10-CM | POA: Insufficient documentation

## 2013-02-13 HISTORY — PX: LAPAROSCOPIC BILATERAL SALPINGECTOMY: SHX5889

## 2013-02-13 HISTORY — PX: BILATERAL SALPINGECTOMY: SHX5743

## 2013-02-13 LAB — CBC
HCT: 38.8 % (ref 36.0–46.0)
MCH: 23.8 pg — ABNORMAL LOW (ref 26.0–34.0)
MCV: 74.5 fL — ABNORMAL LOW (ref 78.0–100.0)
Platelets: 214 10*3/uL (ref 150–400)
RDW: 15.7 % — ABNORMAL HIGH (ref 11.5–15.5)
WBC: 7.7 10*3/uL (ref 4.0–10.5)

## 2013-02-13 SURGERY — SALPINGECTOMY, BILATERAL, LAPAROSCOPIC
Anesthesia: General | Site: Abdomen | Laterality: Bilateral | Wound class: Clean Contaminated

## 2013-02-13 MED ORDER — MIDAZOLAM HCL 2 MG/2ML IJ SOLN
INTRAMUSCULAR | Status: AC
  Filled 2013-02-13: qty 2

## 2013-02-13 MED ORDER — FENTANYL CITRATE 0.05 MG/ML IJ SOLN
INTRAMUSCULAR | Status: AC
  Administered 2013-02-13: 25 ug via INTRAVENOUS
  Filled 2013-02-13: qty 2

## 2013-02-13 MED ORDER — NEOSTIGMINE METHYLSULFATE 1 MG/ML IJ SOLN
INTRAMUSCULAR | Status: DC | PRN
  Administered 2013-02-13: 2.5 mg via INTRAVENOUS

## 2013-02-13 MED ORDER — GLYCOPYRROLATE 0.2 MG/ML IJ SOLN
INTRAMUSCULAR | Status: DC | PRN
  Administered 2013-02-13: 0.4 mg via INTRAVENOUS
  Administered 2013-02-13: 0.2 mg via INTRAVENOUS

## 2013-02-13 MED ORDER — ROCURONIUM BROMIDE 50 MG/5ML IV SOLN
INTRAVENOUS | Status: AC
  Filled 2013-02-13: qty 1

## 2013-02-13 MED ORDER — ONDANSETRON HCL 4 MG/2ML IJ SOLN
INTRAMUSCULAR | Status: DC | PRN
  Administered 2013-02-13: 4 mg via INTRAMUSCULAR

## 2013-02-13 MED ORDER — DEXAMETHASONE SODIUM PHOSPHATE 10 MG/ML IJ SOLN
INTRAMUSCULAR | Status: AC
  Filled 2013-02-13: qty 1

## 2013-02-13 MED ORDER — KETOROLAC TROMETHAMINE 30 MG/ML IJ SOLN
INTRAMUSCULAR | Status: AC
  Filled 2013-02-13: qty 1

## 2013-02-13 MED ORDER — METOCLOPRAMIDE HCL 5 MG/ML IJ SOLN
10.0000 mg | Freq: Once | INTRAMUSCULAR | Status: AC | PRN
  Administered 2013-02-13: 10 mg via INTRAVENOUS

## 2013-02-13 MED ORDER — PROPOFOL 10 MG/ML IV EMUL
INTRAVENOUS | Status: AC
  Filled 2013-02-13: qty 20

## 2013-02-13 MED ORDER — ONDANSETRON HCL 4 MG/2ML IJ SOLN
INTRAMUSCULAR | Status: AC
  Filled 2013-02-13: qty 2

## 2013-02-13 MED ORDER — IBUPROFEN 600 MG PO TABS: 600.0000 mg | ORAL_TABLET | Freq: Four times a day (QID) | ORAL | Status: DC | PRN

## 2013-02-13 MED ORDER — METOCLOPRAMIDE HCL 5 MG/ML IJ SOLN
INTRAMUSCULAR | Status: AC
  Administered 2013-02-13: 10 mg via INTRAVENOUS
  Filled 2013-02-13: qty 2

## 2013-02-13 MED ORDER — PROPOFOL 10 MG/ML IV BOLUS
INTRAVENOUS | Status: DC | PRN
  Administered 2013-02-13: 150 mg via INTRAVENOUS

## 2013-02-13 MED ORDER — MEPERIDINE HCL 25 MG/ML IJ SOLN: 6.2500 mg | INTRAMUSCULAR | Status: DC | PRN

## 2013-02-13 MED ORDER — FENTANYL CITRATE 0.05 MG/ML IJ SOLN
25.0000 ug | INTRAMUSCULAR | Status: DC | PRN
  Administered 2013-02-13: 25 ug via INTRAVENOUS

## 2013-02-13 MED ORDER — KETOROLAC TROMETHAMINE 30 MG/ML IJ SOLN
INTRAMUSCULAR | Status: DC | PRN
  Administered 2013-02-13: 30 mg via INTRAVENOUS

## 2013-02-13 MED ORDER — GLYCOPYRROLATE 0.2 MG/ML IJ SOLN
INTRAMUSCULAR | Status: AC
  Filled 2013-02-13: qty 3

## 2013-02-13 MED ORDER — NEOSTIGMINE METHYLSULFATE 1 MG/ML IJ SOLN
INTRAMUSCULAR | Status: AC
  Filled 2013-02-13: qty 1

## 2013-02-13 MED ORDER — FENTANYL CITRATE 0.05 MG/ML IJ SOLN
INTRAMUSCULAR | Status: AC
  Filled 2013-02-13: qty 5

## 2013-02-13 MED ORDER — OXYCODONE-ACETAMINOPHEN 5-325 MG PO TABS: 1.0000 | ORAL_TABLET | Freq: Four times a day (QID) | ORAL | Status: DC | PRN

## 2013-02-13 MED ORDER — HEPARIN SODIUM (PORCINE) 5000 UNIT/ML IJ SOLN
INTRAMUSCULAR | Status: AC
  Filled 2013-02-13: qty 1

## 2013-02-13 MED ORDER — KETOROLAC TROMETHAMINE 30 MG/ML IJ SOLN: 15.0000 mg | Freq: Once | INTRAMUSCULAR | Status: DC | PRN

## 2013-02-13 MED ORDER — LIDOCAINE HCL (CARDIAC) 20 MG/ML IV SOLN
INTRAVENOUS | Status: DC | PRN
  Administered 2013-02-13: 50 mg via INTRAVENOUS

## 2013-02-13 MED ORDER — MIDAZOLAM HCL 2 MG/2ML IJ SOLN
INTRAMUSCULAR | Status: DC | PRN
  Administered 2013-02-13: 2 mg via INTRAVENOUS

## 2013-02-13 MED ORDER — LACTATED RINGERS IV SOLN
INTRAVENOUS | Status: DC
  Administered 2013-02-13 (×4): via INTRAVENOUS

## 2013-02-13 MED ORDER — LIDOCAINE HCL (CARDIAC) 20 MG/ML IV SOLN
INTRAVENOUS | Status: AC
  Filled 2013-02-13: qty 5

## 2013-02-13 MED ORDER — BUPIVACAINE HCL (PF) 0.25 % IJ SOLN
INTRAMUSCULAR | Status: AC
  Filled 2013-02-13: qty 30

## 2013-02-13 MED ORDER — BUPIVACAINE HCL (PF) 0.25 % IJ SOLN
INTRAMUSCULAR | Status: DC | PRN
  Administered 2013-02-13: 9 mL
  Administered 2013-02-13 (×2): 4 mL

## 2013-02-13 MED ORDER — ROCURONIUM BROMIDE 100 MG/10ML IV SOLN
INTRAVENOUS | Status: DC | PRN
  Administered 2013-02-13: 35 mg via INTRAVENOUS

## 2013-02-13 MED ORDER — FENTANYL CITRATE 0.05 MG/ML IJ SOLN
INTRAMUSCULAR | Status: DC | PRN
  Administered 2013-02-13 (×3): 50 ug via INTRAVENOUS

## 2013-02-13 SURGICAL SUPPLY — 32 items
ADH SKN CLS APL DERMABOND .7 (GAUZE/BANDAGES/DRESSINGS) ×1
BAG SPEC RTRVL LRG 6X4 10 (ENDOMECHANICALS)
CABLE HIGH FREQUENCY MONO STRZ (ELECTRODE) IMPLANT
CHLORAPREP W/TINT 26ML (MISCELLANEOUS) ×2 IMPLANT
CLOTH BEACON ORANGE TIMEOUT ST (SAFETY) ×2 IMPLANT
DERMABOND ADVANCED (GAUZE/BANDAGES/DRESSINGS) ×1
DERMABOND ADVANCED .7 DNX12 (GAUZE/BANDAGES/DRESSINGS) ×1 IMPLANT
FORCEPS CUTTING 33CM 5MM (CUTTING FORCEPS) ×1 IMPLANT
FORCEPS CUTTING 45CM 5MM (CUTTING FORCEPS) IMPLANT
GLOVE BIO SURGEON STRL SZ7.5 (GLOVE) ×2 IMPLANT
GLOVE BIOGEL PI IND STRL 7.5 (GLOVE) ×1 IMPLANT
GLOVE BIOGEL PI INDICATOR 7.5 (GLOVE) ×1
GOWN STRL REIN XL XLG (GOWN DISPOSABLE) ×2 IMPLANT
NEEDLE INSUFFLATION 120MM (ENDOMECHANICALS) ×1 IMPLANT
NS IRRIG 1000ML POUR BTL (IV SOLUTION) ×2 IMPLANT
PACK LAPAROSCOPY BASIN (CUSTOM PROCEDURE TRAY) ×2 IMPLANT
POUCH SPECIMEN RETRIEVAL 10MM (ENDOMECHANICALS) IMPLANT
PROTECTOR NERVE ULNAR (MISCELLANEOUS) ×2 IMPLANT
SET IRRIG TUBING LAPAROSCOPIC (IRRIGATION / IRRIGATOR) IMPLANT
SOLUTION ELECTROLUBE (MISCELLANEOUS) IMPLANT
SUT MON AB 4-0 PS1 27 (SUTURE) ×2 IMPLANT
SUT VICRYL 0 ENDOLOOP (SUTURE) IMPLANT
SUT VICRYL 0 TIES 12 18 (SUTURE) IMPLANT
SUT VICRYL 0 UR6 27IN ABS (SUTURE) ×2 IMPLANT
SYR 50ML LL SCALE MARK (SYRINGE) IMPLANT
TOWEL OR 17X24 6PK STRL BLUE (TOWEL DISPOSABLE) ×4 IMPLANT
TRAY FOLEY CATH 14FR (SET/KITS/TRAYS/PACK) ×2 IMPLANT
TROCAR XCEL NON-BLD 11X100MML (ENDOMECHANICALS) ×2 IMPLANT
TROCAR XCEL NON-BLD 5MMX100MML (ENDOMECHANICALS) ×3 IMPLANT
TROCAR XCEL OPT SLVE 5M 100M (ENDOMECHANICALS) IMPLANT
WARMER LAPAROSCOPE (MISCELLANEOUS) ×2 IMPLANT
WATER STERILE IRR 1000ML POUR (IV SOLUTION) ×2 IMPLANT

## 2013-02-13 NOTE — Interval H&P Note (Signed)
History and Physical Interval Note:  02/13/2013 1:31 PM  [name]  has presented today for surgery, with the diagnosis of Desires Sterilzation- 60 minutes  The various methods of treatment have been discussed with the patient and family. After consideration of risks, benefits and other options for treatment, the patient has consented to  Procedure(s) with comments: LAPAROSCOPIC BILATERAL SALPINGECTOMY POSSIBLE FULGERATION (Bilateral) - Laparoscopic BTL via Fulguration/ possible Bilateral Salpingectomy as a surgical intervention.  The patient's history has been reviewed, patient examined, no change in status, stable for surgery.  I have reviewed the patient's chart and labs.  Questions were answered to the patient's satisfaction.  I have reviewed with and interpreter the procedure once again being most likely laparoscopic bilateral salpingectomy, possible fulguration, discussed discharge instructions and answered questions.  Risks, benefits and alternatives discussed with the patient and consent signed and witnessed.   Purcell Nails

## 2013-02-13 NOTE — Op Note (Signed)
Preop Diagnosis: Desires Surgical Sterilzation  Postop Diagnosis: Desires Surgical Sterilzation  Procedure: LAPAROSCOPIC BILATERAL SALPINGECTOMY  Anesthesia: General   Attending: Purcell Nails, MD   Assistant:  N/a  Findings: Normal appearing bilateral ovaries and tubes  Pathology: Bilateral tubes  Fluids: 1700 cc  UOP: 200 cc  EBL: Minimal  Complications: None  Procedure: The patient was taken to the operating room after the risks, benefits, alternatives, complications, treatment options, and expected outcomes were discussed with the patient. The patient verbalized understanding, the patient concurred with the proposed plan and consent signed and witnessed. The patient was taken to the Operating Room, identified as [facility] and the procedure verified as laparoscopic bilateral salpingectomy possible bilateral fulguration. A Time Out was held and the above information confirmed.  The patient was placed under general anesthesia per anesthesia staff, the patient was placed in modified dorsal lithotomy position and was prepped, draped, and catheterized in the normal, sterile fashion.  The cervix was visualized and an intrauterine manipulator was placed. A  10 mm umbilical incision was then performed. Veress needle was passed and pneumoperitoneum was established. A 10 mm trocar was advanced into the intraabdominal cavity, the laparoscope was introduced and findings as noted above.  A 5mm incision was made suprapubically and 5mm trocar advanced into the intraabdominal cavity under direct visualization.  The same was done in the LLQ.  The right fallopian tube was identified and carried out to its fimbriated end and excised with the Gyrus tripolar.  The same was done on the contralateral side.  The laparoscope was removed and pneumoperitoneum released.  The fascia was repaired with an interrupted stitch of 0 vicryl and the skin was reapproximated with 4-0 monocryl via subcuticular  stitch.  Dermabond was applied to close the 5mm incisions and used to reinforce the 10mm umbilical incision.  Sponge, lap and needle count were correct.  Patient tolerated the procedure well and was returned to the recovery room in good condition.

## 2013-02-13 NOTE — H&P (View-Only) (Signed)
  [name] is a 32 y.o.  female, P2-0-0-2 for tubal sterilization with bilateral salpingectomy.   Has used birth control pills in the past but wants to no longer have children. (Accompanied by advocate-Cindy however, she does not speak the patient's language of [place],  [employer] accessed but no interpreter available in her language)  Patient gave permission for interpreter, Jobe Gibbon (formerly of [employer] for refugees). Patient desires to no longer be able to have children. Has used birth control pills in the past (and currently).  Her menses are very light lasting for 3 days with pad change 3 times a day and no cramps.  Past Medical History  OB History: G: 2;  P 2-0-0-2   C-section in 2002 and 2009  GYN History: menarche: 32 YO;  LMP: 12/30/12;  Contracepton oral contraceptives (estrogen/progesterone)  The patient denies history of sexually transmitted disease.  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  No Known Allergies  ROS: Denies glasses, dentures,  headache, vision changes, nasal congestion, dysphagia, tinnitus, dizziness, hoarseness, cough,  chest pain, shortness of breath, nausea, vomiting, diarrhea,constipation,  urinary frequency, urgency  dysuria, hematuria, vaginitis symptoms, pelvic pain, swelling of joints,easy bruising,  myalgias, arthralgias, skin rashes, unexplained weight loss and except as is mentioned in the history of present illness, patient's review of systems is otherwise negative.  Physical Exam  Bp:  94/62    P:64   R: 12   Weight: 137 lbs.   Height: 5'7.5"     BMI: 21.1   Temperature: 98.1 degrees F orally  Neck: supple without masses or thyromegaly Lungs: clear to auscultation Heart: regular rate and rhythm Abdomen: soft, non-tender and no organomegaly Pelvic:EGBUS- wnl; vagina-normal rugae; uterus-normal size, cervix without lesions or motion tenderness; adnexae-no tenderness or masses Extremities:  no clubbing, cyanosis or edema   Assesment:  Desire for Permanent Sterilization   Disposition:  A discussion was held with patient with interpreter,  Jobe Gibbon,  regarding the indication for her procedure(s) along with the risks, which include but are not limited to: reaction to anesthesia, damage to adjacent organs, infection,  excessive bleeding and risk of pregnancy (2 in 500).  The patient verbalized understanding of these risks and had an opportunity to have her questions answered.  She has consented to having Bilateral Salpingectomy for Sterilization at [facility] on February 13, 2013 at 1:30 p.m.   CSN# [id number]   Analeese Andreatta J. Lowell Guitar, PA-C  for Dr. Woodroe Mode. Su Hilt

## 2013-02-13 NOTE — Transfer of Care (Signed)
Immediate Anesthesia Transfer of Care Note  Patient: [name]  Procedure(s) Performed: Procedure(s) with comments: LAPAROSCOPIC BILATERAL SALPINGECTOMY VIA FULGERATION (Bilateral) - Laparoscopic BTL via Fulguration/ possible Bilateral Salpingectomy BILATERAL SALPINGECTOMY (Bilateral)  Patient Location: PACU  Anesthesia Type:General  Level of Consciousness: awake, alert  and oriented  Airway & Oxygen Therapy: Patient Spontanous Breathing and Patient connected to nasal cannula oxygen  Post-op Assessment: Report given to PACU RN and Post -op Vital signs reviewed and stable  Post vital signs: Reviewed and stable  Complications: No apparent anesthesia complications

## 2013-02-13 NOTE — Anesthesia Procedure Notes (Signed)
Procedure Name: Intubation Date/Time: 02/13/2013 1:48 PM Performed by: Shanon Payor Pre-anesthesia Checklist: Suction available, Timeout performed, Emergency Drugs available, Patient identified and Patient being monitored Patient Re-evaluated:Patient Re-evaluated prior to inductionOxygen Delivery Method: Circle system utilized Preoxygenation: Pre-oxygenation with 100% oxygen Intubation Type: IV induction Ventilation: Mask ventilation without difficulty Laryngoscope Size: Mac and 3 Grade View: Grade I Tube type: Oral Tube size: 7.0 mm Number of attempts: 1 Airway Equipment and Method: Stylet Placement Confirmation: ETT inserted through vocal cords under direct vision,  positive ETCO2 and breath sounds checked- equal and bilateral Secured at: 21 cm Tube secured with: Tape Dental Injury: Teeth and Oropharynx as per pre-operative assessment

## 2013-02-13 NOTE — Anesthesia Preprocedure Evaluation (Signed)
Anesthesia Evaluation  Patient identified by MRN, date of birth, ID band Patient awake    Reviewed: Allergy & Precautions, H&P , NPO status , Patient's Chart, lab work & pertinent test results, reviewed documented beta blocker date and time   History of Anesthesia Complications Negative for: history of anesthetic complications  Airway Mallampati: I TM Distance: >3 FB Neck ROM: full    Dental  (+) Teeth Intact   Pulmonary neg pulmonary ROS,  breath sounds clear to auscultation  Pulmonary exam normal       Cardiovascular negative cardio ROS  Rhythm:regular Rate:Normal     Neuro/Psych negative neurological ROS  negative psych ROS   GI/Hepatic negative GI ROS, Neg liver ROS, PUD (h/o),   Endo/Other  negative endocrine ROS  Renal/GU negative Renal ROS  negative genitourinary   Musculoskeletal   Abdominal   Peds  Hematology negative hematology ROS (+)   Anesthesia Other Findings   Reproductive/Obstetrics negative OB ROS                           Anesthesia Physical Anesthesia Plan  ASA: I  Anesthesia Plan: General ETT   Post-op Pain Management:    Induction:   Airway Management Planned:   Additional Equipment:   Intra-op Plan:   Post-operative Plan:   Informed Consent: I have reviewed the patients History and Physical, chart, labs and discussed the procedure including the risks, benefits and alternatives for the proposed anesthesia with the patient or authorized representative who has indicated his/her understanding and acceptance.   Dental Advisory Given  Plan Discussed with: CRNA and Surgeon  Anesthesia Plan Comments:         Anesthesia Quick Evaluation

## 2013-02-14 ENCOUNTER — Encounter (HOSPITAL_COMMUNITY): Payer: Self-pay | Admitting: Obstetrics and Gynecology

## 2013-02-15 NOTE — Anesthesia Postprocedure Evaluation (Signed)
  Anesthesia Post-op Note  Patient: [name]  Procedure(s) Performed: Procedure(s) with comments: LAPAROSCOPIC BILATERAL SALPINGECTOMY VIA FULGERATION (Bilateral) - Laparoscopic BTL via Fulguration/ possible Bilateral Salpingectomy BILATERAL SALPINGECTOMY (Bilateral) Patient is awake and responsive. Pain and nausea are reasonably well controlled. Vital signs are stable and clinically acceptable. Oxygen saturation is clinically acceptable. There are no apparent anesthetic complications at this time. Patient is ready for discharge.

## 2013-02-24 MED FILL — Heparin Sodium (Porcine) Inj 5000 Unit/ML: INTRAMUSCULAR | Qty: 1 | Status: AC

## 2013-06-19 ENCOUNTER — Ambulatory Visit (INDEPENDENT_AMBULATORY_CARE_PROVIDER_SITE_OTHER): Payer: 59 | Admitting: Family Medicine

## 2013-06-19 ENCOUNTER — Encounter: Payer: Self-pay | Admitting: Family Medicine

## 2013-06-19 VITALS — BP 102/74 | HR 63 | Temp 97.6°F | Ht 62.0 in | Wt 143.0 lb

## 2013-06-19 DIAGNOSIS — M545 Low back pain, unspecified: Secondary | ICD-10-CM

## 2013-06-19 DIAGNOSIS — R1013 Epigastric pain: Secondary | ICD-10-CM

## 2013-06-19 MED ORDER — NAPROXEN 500 MG PO TABS: 500.0000 mg | ORAL_TABLET | Freq: Two times a day (BID) | ORAL | Status: DC

## 2013-06-19 MED ORDER — OMEPRAZOLE 20 MG PO CPDR: 20.0000 mg | DELAYED_RELEASE_CAPSULE | Freq: Every day | ORAL | Status: DC

## 2013-06-19 NOTE — Patient Instructions (Signed)
Thank you for coming in,   I think you have a muscle strain in your lower back.    I want you to take Naproxen 500 mg two times a day for 14 days. I also want you trying some of the home exercises that I showed you.   I don't want you to take any Advil or Aleve.   Please try these exercises for 14 days and then follow up with me again.   If your pain is not under control, please call me back and I may add another medication.    Please feel free to call with any questions or concerns at any time, at [phone]. --Dr. Jordan LikesSchmitz

## 2013-06-21 NOTE — Assessment & Plan Note (Signed)
Most likely muscle strain. Doubt disc herniation, fracture, spinal stenosis, abscess.  - Naproxen BID for 14 days  - Prilosec 20 mg daily  - Home modalities given  - Heat as needed (20 min on/off)  - work restriction note given  - will f/u in 14 days  - discussed with Dr. Deirdre Priesthambliss
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
## 2013-07-03 ENCOUNTER — Ambulatory Visit (HOSPITAL_COMMUNITY)
Admission: RE | Admit: 2013-07-03 | Discharge: 2013-07-03 | Disposition: A | Discharge status: Home / Self Care | Payer: 59 | Source: Ambulatory Visit | Attending: Family Medicine | Admitting: Family Medicine

## 2013-07-03 ENCOUNTER — Encounter: Payer: Self-pay | Admitting: Family Medicine

## 2013-07-03 ENCOUNTER — Ambulatory Visit (INDEPENDENT_AMBULATORY_CARE_PROVIDER_SITE_OTHER): Payer: 59 | Admitting: Family Medicine

## 2013-07-03 ENCOUNTER — Telehealth: Payer: Self-pay | Admitting: Family Medicine

## 2013-07-03 VITALS — BP 99/76 | HR 67 | Temp 98.9°F | Ht 60.25 in | Wt 143.0 lb

## 2013-07-03 DIAGNOSIS — M545 Low back pain, unspecified: Secondary | ICD-10-CM

## 2013-07-03 DIAGNOSIS — M25559 Pain in unspecified hip: Secondary | ICD-10-CM | POA: Insufficient documentation

## 2013-07-03 DIAGNOSIS — M4306 Spondylolysis, lumbar region: Secondary | ICD-10-CM | POA: Insufficient documentation

## 2013-07-03 DIAGNOSIS — M549 Dorsalgia, unspecified: Secondary | ICD-10-CM | POA: Insufficient documentation

## 2013-07-03 LAB — POCT URINE PREGNANCY: Preg Test, Ur: NEGATIVE

## 2013-07-03 MED ORDER — CYCLOBENZAPRINE HCL 5 MG PO TABS: 5.0000 mg | ORAL_TABLET | Freq: Three times a day (TID) | ORAL | Status: DC | PRN

## 2013-07-03 MED ORDER — OXYCODONE-ACETAMINOPHEN 5-325 MG PO TABS: 1.0000 | ORAL_TABLET | Freq: Three times a day (TID) | ORAL | Status: DC | PRN

## 2013-07-03 NOTE — Progress Notes (Signed)
   Subjective:    Patient ID: [name], female    DOB: 12/10/1980, 33 y.o.   MRN: [id number]  HPI  Back pain: Pt had a slip like fall on the 18th of this month. She was seen in this office and given naproxen. She has tried using a heating pad as well. She has reports neither of them are helping her pain. The pain is worse with walking and sitting. It is better if she lays on her abdomen. She feels a sharp stabbing pain that then radiates down to her knee posteriorly. Pain starts in left lower back/hip region, radiates around hip and down posteriorly to knee.  Review of Systems Negative, with the exception of above mentioned in HPI  Objective:   Physical Exam BP 99/76  Pulse 67  Temp(Src) 98.9 F (37.2 C) (Oral)  Ht 5' 0.25" (1.53 m)  Wt 143 lb (64.864 kg)  BMI 27.71 kg/m2 Gen: NAD. Limping, in pain  CV: RRR  Chest: CTAB, no wheeze or crackles MSK: Tissue texture change over left lumbar region. Patient has tightness over this area. Tender to palpation over PSIS, gluteus and left greater trochanter of the hip. Gait abnormality present with limping. She able to bear weight on left hip. Able to bear all weight on left leg. 5 out of 5 muscle strength bilaterally lower extremities. DTRs equal and bilateral. Pain with left flexion of hip.

## 2013-07-03 NOTE — Patient Instructions (Signed)
Lumbosacral Strain Lumbosacral strain is a strain of any of the parts that make up your lumbosacral vertebrae. Your lumbosacral vertebrae are the bones that make up the lower third of your backbone. Your lumbosacral vertebrae are held together by muscles and tough, fibrous tissue (ligaments).  CAUSES  A sudden blow to your back can cause lumbosacral strain. Also, anything that causes an excessive stretch of the muscles in the low back can cause this strain. This is typically seen when people exert themselves strenuously, fall, lift heavy objects, bend, or crouch repeatedly. RISK FACTORS  Physically demanding work.  Participation in pushing or pulling sports or sports that require sudden twist of the back (tennis, golf, baseball).  Weight lifting.  Excessive lower back curvature.  Forward-tilted pelvis.  Weak back or abdominal muscles or both.  Tight hamstrings. SIGNS AND SYMPTOMS  Lumbosacral strain may cause pain in the area of your injury or pain that moves (radiates) down your leg.  DIAGNOSIS Your health care provider can often diagnose lumbosacral strain through a physical exam. In some cases, you may need tests such as X-ray exams.  TREATMENT  Treatment for your lower back injury depends on many factors that your clinician will have to evaluate. However, most treatment will include the use of anti-inflammatory medicines. HOME CARE INSTRUCTIONS   Avoid hard physical activities (tennis, racquetball, waterskiing) if you are not in proper physical condition for it. This may aggravate or create problems.  If you have a back problem, avoid sports requiring sudden body movements. Swimming and walking are generally safer activities.  Maintain good posture.  Maintain a healthy weight.  For acute conditions, you may put ice on the injured area.  Put ice in a plastic bag.  Place a towel between your skin and the bag.  Leave the ice on for 20 minutes, 2 3 times a day.  When the  low back starts healing, stretching and strengthening exercises may be recommended. SEEK MEDICAL CARE IF:  Your back pain is getting worse.  You experience severe back pain not relieved with medicines. SEEK IMMEDIATE MEDICAL CARE IF:   You have numbness, tingling, weakness, or problems with the use of your arms or legs.  There is a change in bowel or bladder control.  You have increasing pain in any area of the body, including your belly (abdomen).  You notice shortness of breath, dizziness, or feel faint.  You feel sick to your stomach (nauseous), are throwing up (vomiting), or become sweaty.  You notice discoloration of your toes or legs, or your feet get very cold. MAKE SURE YOU:   Understand these instructions.  Will watch your condition.  Will get help right away if you are not doing well or get worse. Document Released: 01/28/2005 Document Revised: 02/08/2013 Document Reviewed: 12/07/2012 [facility] Patient Information 2014 [place], [place].  I will need to followup with you in 10 days, sooner if you aren't experiencing increased pain.

## 2013-07-03 NOTE — Telephone Encounter (Signed)
Relayed message,patient voiced understanding. Janae Bonser S  

## 2013-07-03 NOTE — Telephone Encounter (Signed)
Please call pt and inform her the results of her xray are normal. No fractures. She speaks limited English, but should understand this. I will also mail her the results. Thanks.

## 2013-07-03 NOTE — Assessment & Plan Note (Addendum)
Most likely muscle strain or a sciatica irritation. Patient did hear an audible pop at time of injury. Lumbar and left hip x-rays now. Urine pregnancy completed in office. Oxycodone 5/325 #30 Flexeril #30 Encouraged heat to still he placed nightly Will call patient with results Followup in 10 days or sooner if not feeling better.

## 2014-09-05 ENCOUNTER — Ambulatory Visit (INDEPENDENT_AMBULATORY_CARE_PROVIDER_SITE_OTHER): Payer: 59 | Admitting: Family Medicine

## 2014-09-05 ENCOUNTER — Encounter: Payer: Self-pay | Admitting: Family Medicine

## 2014-09-05 VITALS — BP 104/73 | HR 60 | Temp 98.0°F | Wt 137.7 lb

## 2014-09-05 DIAGNOSIS — H1013 Acute atopic conjunctivitis, bilateral: Secondary | ICD-10-CM | POA: Diagnosis not present

## 2014-09-05 DIAGNOSIS — J309 Allergic rhinitis, unspecified: Principal | ICD-10-CM

## 2014-09-05 DIAGNOSIS — H1010 Acute atopic conjunctivitis, unspecified eye: Secondary | ICD-10-CM | POA: Insufficient documentation

## 2014-09-05 MED ORDER — CETIRIZINE HCL 10 MG PO TABS: 10.0000 mg | ORAL_TABLET | Freq: Every day | ORAL | Status: DC

## 2014-09-05 MED ORDER — OLOPATADINE HCL 0.2 % OP SOLN: 1.0000 [drp] | Freq: Every day | OPHTHALMIC | Status: DC

## 2014-09-05 NOTE — Progress Notes (Signed)
   Subjective:    Patient ID: [name], female    DOB: 09/13/1980, 34 y.o.   MRN: [id number]  HPI Pt presents for eye itching and redness. Reports it started about a month ago and always happens this time of year. She is also having some runny nose, itching nose, and occasional mild cough. No fevers, facial pain. Rhinorrhea is clear and bilateral.    Review of Systems See HPI    Objective:   Physical Exam  Constitutional: She is oriented to person, place, and time. She appears well-developed and well-nourished. No distress.  HENT:  Head: Normocephalic and atraumatic.  Right Ear: External ear normal.  Left Ear: External ear normal.  Nose: Rhinorrhea present. No mucosal edema or sinus tenderness. Right sinus exhibits no maxillary sinus tenderness and no frontal sinus tenderness. Left sinus exhibits no maxillary sinus tenderness and no frontal sinus tenderness.  Mouth/Throat: Uvula is midline, oropharynx is clear and moist and mucous membranes are normal.  Eyes: EOM are normal. Pupils are equal, round, and reactive to light. Right eye exhibits no exudate and no hordeolum. No foreign body present in the right eye. Left eye exhibits no exudate and no hordeolum. No foreign body present in the left eye. Right conjunctiva is injected. Left conjunctiva is injected. No scleral icterus.  Neck: Normal range of motion. Neck supple.  Cardiovascular: Normal rate, regular rhythm and normal heart sounds.   No murmur heard. Pulmonary/Chest: Effort normal and breath sounds normal. No respiratory distress. She has no wheezes.  Abdominal: She exhibits no distension.  Lymphadenopathy:    She has no cervical adenopathy.  Neurological: She is alert and oriented to person, place, and time.  Skin: Skin is warm and dry. No rash noted. She is not diaphoretic.  Psychiatric: She has a normal mood and affect. Her behavior is normal.  Nursing note and vitals reviewed.         Assessment & Plan:

## 2014-09-05 NOTE — Patient Instructions (Signed)
Allergic Conjunctivitis  The conjunctiva is a thin membrane that covers the visible white part of the eyeball and the underside of the eyelids. This membrane protects and lubricates the eye. The membrane has small blood vessels running through it that can normally be seen. When the conjunctiva becomes inflamed, the condition is called conjunctivitis. In response to the inflammation, the conjunctival blood vessels become swollen. The swelling results in redness in the normally white part of the eye.  The blood vessels of this membrane also react when a person has allergies and is then called allergic conjunctivitis. This condition usually lasts for as long as the allergy persists. Allergic conjunctivitis cannot be passed to another person (non-contagious). The likelihood of bacterial infection is great and the cause is not likely due to allergies if the inflamed eye has:  · A sticky discharge.  · Discharge or sticking together of the lids in the morning.  · Scaling or flaking of the eyelids where the eyelashes come out.  · Red swollen eyelids.  CAUSES   · Viruses.  · Irritants such as foreign bodies.  · Chemicals.  · General allergic reactions.  · Inflammation or serious diseases in the inside or the outside of the eye or the orbit (the boney cavity in which the eye sits) can cause a "red eye."  SYMPTOMS   · Eye redness.  · Tearing.  · Itchy eyes.  · Burning feeling in the eyes.  · Clear drainage from the eye.  · Allergic reaction due to pollens or ragweed sensitivity. Seasonal allergic conjunctivitis is frequent in the spring when pollens are in the air and in the fall.  DIAGNOSIS   This condition, in its many forms, is usually diagnosed based on the history and an ophthalmological exam. It usually involves both eyes. If your eyes react at the same time every year, allergies may be the cause. While most "red eyes" are due to allergy or an infection, the role of an eye (ophthalmological) exam is important. The exam  can rule out serious diseases of the eye or orbit.  TREATMENT   · Non-antibiotic eye drops, ointments, or medications by mouth may be prescribed if the ophthalmologist is sure the conjunctivitis is due to allergies alone.  · Over-the-counter drops and ointments for allergic symptoms should be used only after other causes of conjunctivitis have been ruled out, or as your caregiver suggests.  Medications by mouth are often prescribed if other allergy-related symptoms are present. If the ophthalmologist is sure that the conjunctivitis is due to allergies alone, treatment is normally limited to drops or ointments to reduce itching and burning.  HOME CARE INSTRUCTIONS   · Wash hands before and after applying drops or ointments, or touching the inflamed eye(s) or eyelids.  · Do not let the eye dropper tip or ointment tube touch the eyelid when putting medicine in your eye.  · Stop using your soft contact lenses and throw them away. Use a new pair of lenses when recovery is complete. You should run through sterilizing cycles at least three times before use after complete recovery if the old soft contact lenses are to be used. Hard contact lenses should be stopped. They need to be thoroughly sterilized before use after recovery.  · Itching and burning eyes due to allergies is often relieved by using a cool cloth applied to closed eye(s).  SEEK MEDICAL CARE IF:   · Your problems do not go away after two or three days of treatment.  ·   Your lids are sticky (especially in the morning when you wake up) or stick together.  · Discharge develops. Antibiotics may be needed either as drops, ointment, or by mouth.  · You have extreme light sensitivity.  · An oral temperature above 102° F (38.9° C) develops.  · Pain in or around the eye or any other visual symptom develops.  MAKE SURE YOU:   · Understand these instructions.  · Will watch your condition.  · Will get help right away if you are not doing well or get worse.  Document  Released: 07/11/2002 Document Revised: 07/13/2011 Document Reviewed: 06/06/2007  Exit[employer]® Patient Information ©2015 [employer]. This information is not intended to replace advice given to you by your health care provider. Make sure you discuss any questions you have with your health care provider.

## 2014-09-05 NOTE — Assessment & Plan Note (Signed)
Seasonal eye itching and redness with some cough and congestion - start zyrtec, pataday - f/u in 2 weeks if not better

## 2014-10-25 ENCOUNTER — Ambulatory Visit (INDEPENDENT_AMBULATORY_CARE_PROVIDER_SITE_OTHER): Payer: 59 | Admitting: Family Medicine

## 2014-10-25 ENCOUNTER — Encounter: Payer: Self-pay | Admitting: Family Medicine

## 2014-10-25 VITALS — BP 94/64 | HR 63 | Temp 98.2°F | Resp 18 | Wt 135.0 lb

## 2014-10-25 DIAGNOSIS — K649 Unspecified hemorrhoids: Secondary | ICD-10-CM | POA: Insufficient documentation

## 2014-10-25 DIAGNOSIS — K5900 Constipation, unspecified: Secondary | ICD-10-CM | POA: Diagnosis not present

## 2014-10-25 DIAGNOSIS — K640 First degree hemorrhoids: Secondary | ICD-10-CM

## 2014-10-25 HISTORY — DX: Constipation, unspecified: K59.00

## 2014-10-25 LAB — TSH: TSH: 1.492 u[IU]/mL (ref 0.350–4.500)

## 2014-10-25 MED ORDER — HYDROCORTISONE 2.5 % RE CREA: 1.0000 "application " | TOPICAL_CREAM | Freq: Three times a day (TID) | RECTAL | Status: DC

## 2014-10-25 MED ORDER — POLYETHYLENE GLYCOL 3350 17 GM/SCOOP PO POWD: 17.0000 g | Freq: Every day | ORAL | Status: DC

## 2014-10-25 NOTE — Patient Instructions (Signed)
Hemorrhoids Hemorrhoids are swollen veins around the rectum or anus. There are two types of hemorrhoids:   Internal hemorrhoids. These occur in the veins just inside the rectum. They may poke through to the outside and become irritated and painful.  External hemorrhoids. These occur in the veins outside the anus and can be felt as a painful swelling or hard lump near the anus. CAUSES  Pregnancy.   Obesity.   Constipation or diarrhea.   Straining to have a bowel movement.   Sitting for long periods on the toilet.  Heavy lifting or other activity that caused you to strain.  Anal intercourse. SYMPTOMS   Pain.   Anal itching or irritation.   Rectal bleeding.   Fecal leakage.   Anal swelling.   One or more lumps around the anus.  DIAGNOSIS  Your caregiver may be able to diagnose hemorrhoids by visual examination. Other examinations or tests that may be performed include:   Examination of the rectal area with a gloved hand (digital rectal exam).   Examination of anal canal using a small tube (scope).   A blood test if you have lost a significant amount of blood.  A test to look inside the colon (sigmoidoscopy or colonoscopy). TREATMENT Most hemorrhoids can be treated at home. However, if symptoms do not seem to be getting better or if you have a lot of rectal bleeding, your caregiver may perform a procedure to help make the hemorrhoids get smaller or remove them completely. Possible treatments include:   Placing a rubber band at the base of the hemorrhoid to cut off the circulation (rubber band ligation).   Injecting a chemical to shrink the hemorrhoid (sclerotherapy).   Using a tool to burn the hemorrhoid (infrared light therapy).   Surgically removing the hemorrhoid (hemorrhoidectomy).   Stapling the hemorrhoid to block blood flow to the tissue (hemorrhoid stapling).  HOME CARE INSTRUCTIONS   Eat foods with fiber, such as whole grains, beans,  nuts, fruits, and vegetables. Ask your doctor about taking products with added fiber in them (fibersupplements).  Increase fluid intake. Drink enough water and fluids to keep your urine clear or pale yellow.   Exercise regularly.   Go to the bathroom when you have the urge to have a bowel movement. Do not wait.   Avoid straining to have bowel movements.   Keep the anal area dry and clean. Use wet toilet paper or moist towelettes after a bowel movement.   Medicated creams and suppositories may be used or applied as directed.   Only take over-the-counter or prescription medicines as directed by your caregiver.   Take warm sitz baths for 15-20 minutes, 3-4 times a day to ease pain and discomfort.   Place ice packs on the hemorrhoids if they are tender and swollen. Using ice packs between sitz baths may be helpful.   Put ice in a plastic bag.   Place a towel between your skin and the bag.   Leave the ice on for 15-20 minutes, 3-4 times a day.   Do not use a donut-shaped pillow or sit on the toilet for long periods. This increases blood pooling and pain.  SEEK MEDICAL CARE IF:  You have increasing pain and swelling that is not controlled by treatment or medicine.  You have uncontrolled bleeding.  You have difficulty or you are unable to have a bowel movement.  You have pain or inflammation outside the area of the hemorrhoids. MAKE SURE YOU:  Understand these instructions.  Will watch your condition.  Will get help right away if you are not doing well or get worse. Document Released: 04/17/2000 Document Revised: 04/06/2012 Document Reviewed: 02/23/2012 [facility] Patient Information 2015 [place], [place]. This information is not intended to replace advice given to you by your health care provider. Make sure you discuss any questions you have with your health care provider.  Constipation Constipation is when a person has fewer than three bowel movements a week, has  difficulty having a bowel movement, or has stools that are dry, hard, or larger than normal. As people grow older, constipation is more common. If you try to fix constipation with medicines that make you have a bowel movement (laxatives), the problem may get worse. Long-term laxative use may cause the muscles of the colon to become weak. A low-fiber diet, not taking in enough fluids, and taking certain medicines may make constipation worse.  CAUSES   Certain medicines, such as antidepressants, pain medicine, iron supplements, antacids, and water pills.   Certain diseases, such as diabetes, irritable bowel syndrome (IBS), thyroid disease, or depression.   Not drinking enough water.   Not eating enough fiber-rich foods.   Stress or travel.   Lack of physical activity or exercise.   Ignoring the urge to have a bowel movement.   Using laxatives too much.  SIGNS AND SYMPTOMS   Having fewer than three bowel movements a week.   Straining to have a bowel movement.   Having stools that are hard, dry, or larger than normal.   Feeling full or bloated.   Pain in the lower abdomen.   Not feeling relief after having a bowel movement.  DIAGNOSIS  Your health care provider will take a medical history and perform a physical exam. Further testing may be done for severe constipation. Some tests may include:  A barium enema X-ray to examine your rectum, colon, and, sometimes, your small intestine.   A sigmoidoscopy to examine your lower colon.   A colonoscopy to examine your entire colon. TREATMENT  Treatment will depend on the severity of your constipation and what is causing it. Some dietary treatments include drinking more fluids and eating more fiber-rich foods. Lifestyle treatments may include regular exercise. If these diet and lifestyle recommendations do not help, your health care provider may recommend taking over-the-counter laxative medicines to help you have bowel  movements. Prescription medicines may be prescribed if over-the-counter medicines do not work.  HOME CARE INSTRUCTIONS   Eat foods that have a lot of fiber, such as fruits, vegetables, whole grains, and beans.  Limit foods high in fat and processed sugars, such as french fries, hamburgers, cookies, candies, and soda.   A fiber supplement may be added to your diet if you cannot get enough fiber from foods.   Drink enough fluids to keep your urine clear or pale yellow.   Exercise regularly or as directed by your health care provider.   Go to the restroom when you have the urge to go. Do not hold it.   Only take over-the-counter or prescription medicines as directed by your health care provider. Do not take other medicines for constipation without talking to your health care provider first.  SEEK IMMEDIATE MEDICAL CARE IF:   You have bright red blood in your stool.   Your constipation lasts for more than 4 days or gets worse.   You have abdominal or rectal pain.   You have thin, pencil-like stools.   You have unexplained weight loss.  MAKE SURE YOU:   Understand these instructions.  Will watch your condition.  Will get help right away if you are not doing well or get worse. Document Released: 01/17/2004 Document Revised: 04/25/2013 Document Reviewed: 01/30/2013 [facility] Patient Information 2015 [place], [place]. This information is not intended to replace advice given to you by your health care provider. Make sure you discuss any questions you have with your health care provider.  Have called you in Miralax this is the powder we discussed you taken daily to keep your bowel movements normal. Start out with a capful a day, this will have a line inside the To feel the pallor to. You need to mix this with 8 ounces of water. You can increase or decrease this powder depending upon if your stools are soft or hard. If your stools get too soft decrease the amount of powder to a half  of it. You need to make sure you're drinking plenty of water a day, at least 60-70 ounces of water. Eat plenty of healthy vegetables. I have also called you in a medication cream for your hemorrhoids. We will collect some blood from you today to check your thyroid as well as this can cause constipation.

## 2014-10-25 NOTE — Assessment & Plan Note (Signed)
[name] is a 34 y.o. [place] female presents for itching hemorrhoids. - Exam today with mild hemorrhoid, Anusol cream prescribed. Agent with constipation and straining which likely exacerbated her hemorrhoids. Provided prescription for Miralax with tapering instructions. TSH collected today for complaints of straining and frequent constipation. Urged patient to drink plenty water a day. In the healthy vegetables to increase her fiber in her diet. Interpreter was present for office visit, and patient was able to ask questions. Patient voiced understanding to all instructions.

## 2014-10-25 NOTE — Progress Notes (Signed)
   Subjective:    Patient ID: [name], female    DOB: 03/03/1981, 34 y.o.   MRN: [id number]  HPI [name] interpreter present for office visit.  Anal itching: Patient presents to family practice for anal itching. She reports histories of hemorrhoids in the past, that she can't remember what she used but took it away for her. She has not needed to use anything for hemorrhoids and a few years. She reports no bleeding, no pain or fever currently. She reports approximately 1 week ago she did notice a small amount of pain but that has since subsided in the only thing she complains of today is anal itching. Agent does state that she has to strain frequently with her bowel movements, and is constipated frequently. She noticed the discomfort in her anus region approximately 3 months ago. She has tried no over-the-counter medications. She is currently not on any medications. She states she does have a bowel movement once a day, but it is hard and she does strain. Never smoker Past Medical History  Diagnosis Date  . GERD (gastroesophageal reflux disease)   . Pap smear for cervical cancer screening 09/02/2011   No Known Allergies Past Surgical History  Procedure Laterality Date  . Cesarean section    . Esophagogastroduodenoscopy  09/29/2011    Procedure: ESOPHAGOGASTRODUODENOSCOPY (EGD);  Surgeon: Petra Kuba, MD;  Location: [facility];  Service: Endoscopy;  Laterality: N/A;  . Laparoscopic bilateral salpingectomy Bilateral 02/13/2013    Procedure: LAPAROSCOPIC BILATERAL SALPINGECTOMY VIA FULGERATION;  Surgeon: Purcell Nails, MD;  Location: [facility];  Service: Gynecology;  Laterality: Bilateral;  Laparoscopic BTL via Fulguration/ possible Bilateral Salpingectomy  . Bilateral salpingectomy Bilateral 02/13/2013    Procedure: BILATERAL SALPINGECTOMY;  Surgeon: Purcell Nails, MD;  Location: [facility];  Service: Gynecology;  Laterality: Bilateral;   Review of Systems Per HPI    Objective:   Physical Exam BP 94/64 mmHg  Pulse 63  Temp(Src) 98.2 F (36.8 C) (Oral)  Resp 18  Wt 135 lb (61.236 kg)  SpO2 99%  LMP  (LMP Unknown) Gen: NAD. Nontoxic in appearance, well-developed, well-nourished female. Pleasant, cooperative with exam. Interpreter present throughout entire exam. HEENT: AT. [place].  Bilateral eyes without injections or icterus. MMM.  Abd: Soft. Round. NTND. BS present. No Masses palpated. Moderate stool burden palpated. GU: Very small external hemorrhoid at 8 o'clock. Mild internal hemorrhoid also present. No tenderness to palpation. No bleeding noted on exam. No Swelling or thrombosis present.    Assessment & Plan:  [name] is a 34 y.o. [place] female presents for itching hemorrhoids. - Exam today with mild hemorrhoid, Anusol cream prescribed. Agent with constipation and straining which likely exacerbated her hemorrhoids. Provided prescription for Miralax with tapering instructions. TSH collected today for complaints of straining and frequent constipation. Urged patient to drink plenty water a day. In the healthy vegetables to increase her fiber in her diet. Interpreter was present for office visit, and patient was able to ask questions. Patient voiced understanding to all instructions.

## 2014-10-25 NOTE — Assessment & Plan Note (Signed)
[name] is a 34 y.o. [place] female presents for itching hemorrhoids. - Exam today with mild hemorrhoid, Anusol cream prescribed. Agent with constipation and straining which likely exacerbated her hemorrhoids. Provided prescription for Miralax with tapering instructions. TSH collected today for complaints of straining and frequent constipation. Urged patient to drink plenty water a day. In the healthy vegetables to increase her fiber in her diet. Interpreter was present for office visit, and patient was able to ask questions. Patient voiced understanding to all instructions. 

## 2014-10-26 ENCOUNTER — Encounter: Payer: Self-pay | Admitting: Family Medicine

## 2015-12-07 DIAGNOSIS — H5203 Hypermetropia, bilateral: Secondary | ICD-10-CM | POA: Diagnosis not present

## 2016-01-03 ENCOUNTER — Ambulatory Visit: Payer: Self-pay | Admitting: Family Medicine

## 2016-08-07 ENCOUNTER — Encounter: Payer: Self-pay | Admitting: Family Medicine

## 2016-08-07 ENCOUNTER — Ambulatory Visit (INDEPENDENT_AMBULATORY_CARE_PROVIDER_SITE_OTHER): Payer: 59 | Admitting: Family Medicine

## 2016-08-07 VITALS — BP 100/50 | HR 60 | Temp 98.2°F | Ht 60.5 in | Wt 134.0 lb

## 2016-08-07 DIAGNOSIS — K219 Gastro-esophageal reflux disease without esophagitis: Secondary | ICD-10-CM

## 2016-08-07 MED ORDER — OMEPRAZOLE 20 MG PO CPDR: 20.0000 mg | DELAYED_RELEASE_CAPSULE | Freq: Two times a day (BID) | ORAL | 1 refills | Status: DC

## 2016-08-07 MED FILL — OMEPRAZOLE DR 20 MG CAPSULE: 20 | 30 days supply | Qty: 60 | Fill #0

## 2016-08-07 NOTE — Progress Notes (Signed)
   Subjective:   Patient ID: [name]    DOB: [birth year], 36 y.o. female   MRN: [id number]  CC: burning sensation in throat.   HPI: [name] is a 36 y.o. female who presents to clinic today for a burning sensation in her throat.  She is present today with interpreter for [place] dialect [facility]).    Has been having this sensation in throat x 1 week.  Has had history of reflux, gastritis,  gastric ulcer and +for h pylori.  Occurs when she eats spicy and hot foods and therefore has tried to cut down.  Notes a metallic taste to her foods. Diet consists mainly of rice, chicken, meats, vegetables.  Does not drink coffees and teas.  Likes to eat her food very hot (in temperature).  Eats a lot of oranges, acidic fruits.  No history of pancreatitis.  The sensation is uncomfortable, has not vomited. The food sometimes does reflux into her mouth a little bit. Omeprazole has helped in the past.  She does not currently take any medicine for reflux.  ROS: Denies fevers, chills, abdominal pain, nausea, vomiting, diarrhea.  No recent travel.    Smoking status reviewed.  Patient is a never smoker.  No drug or alcohol use.   Medications reviewed.  Objective:   BP (!) 100/50   Pulse 60   Temp 98.2 F (36.8 C) (Oral)   Ht 5' 0.5" (1.537 m)   Wt 134 lb (60.8 kg)   LMP 05/30/2016   SpO2 99%   BMI 25.74 kg/m  Vitals and nursing note reviewed.  General: 36 yo F, well nourished, well developed, in no acute distress HEENT: normocephalic, atraumatic, moist mucous membranes, no tonsillar exudates or erythema Neck: supple, non-tender without lymphadenopathy CV: regular rate and rhythm without murmurs rubs or gallops Lungs: clear to auscultation bilaterally with normal work of breathing Abdomen: soft, non-tender, no masses or organomegaly palpable, normoactive bowel sounds Skin: warm, dry, no rashes or lesions, cap refill < 2 seconds Extremities: warm and well perfused, normal tone  Assessment &  Plan:   Reflux Patient with history of gastritis, h pylori and reflux. Reviewed EGD from 09/2011 by Dr. Ewing Schlein.  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  Normal stomach s/p biopsy to r/o H. Pylori. At that time, recommended to consider trial of antispasmodic and CT scan if symptoms continue.  I do not see a CT scan in [facility] but she is denying abdominal pain today and current symptoms likely consistent with GERD.  Not taking a PPI but reports Omeprazole has helped in the past.  -Will prescribe Omeprazole 20 mg BID with meals  -Advised patient to refrain from eating spicy and hot foods -Avoid eating late at night -Avoid eating and laying down right away   Meds ordered this encounter  Medications  . omeprazole (PRILOSEC) 20 MG capsule    Sig: Take 1 capsule (20 mg total) by mouth 2 (two) times daily.    Dispense:  60 capsule    Refill:  1   Follow up: 4 weeks   Freddrick March, MD [facility], PGY-1 08/11/2016 11:54 PM

## 2016-08-07 NOTE — Patient Instructions (Addendum)
It was really nice to meet you today! You were seen in clinic for burning sensation in the throat.  Your symptoms are likely related to reflux and I have sent a prescription for Omeprazole to your pharmacy.  Please take this twice daily 20-30 min prior to meals.  In addition, we discussed staying away from spicy/hot foods as this can make symptoms worse.  Avoid eating late at night as well as eating and laying down right away.  I expect you should start feeling better soon.   Please call clinic with any questions.   Be well,  Freddrick March, MD

## 2016-10-02 ENCOUNTER — Ambulatory Visit: Payer: Self-pay | Admitting: Family Medicine

## 2016-10-02 NOTE — Progress Notes (Deleted)
   Subjective:   [name] is a 36 y.o. female with a history of GERD, constipation, lumbago here for same day appt for No chief complaint on file.    ***  Review of Systems:  Per HPI.   Social History: *** smoker  Objective:  There were no vitals taken for this visit.  Gen:  36 y.o. female in NAD *** HEENT: NCAT, MMM, EOMI, PERRL, anicteric sclerae CV: RRR, no MRG, no JVD Resp: Non-labored, CTAB, no wheezes noted Abd: Soft, NTND, BS present, no guarding or organomegaly Ext: WWP, no edema MSK: Full ROM, strength intact Neuro: Alert and oriented, speech normal       Chemistry      Component Value Date/Time   NA 133 (L) 01/05/2013 1612   K 2.9 (L) 01/05/2013 1612   CL 99 01/05/2013 1612   CO2 24 01/05/2013 1612   BUN 7 01/05/2013 1612   CREATININE 0.76 01/05/2013 1612      Component Value Date/Time   CALCIUM 8.0 (L) 01/05/2013 1612   ALKPHOS 40 08/03/2011 2238   AST 27 08/03/2011 2238   ALT 17 08/03/2011 2238   BILITOT 0.3 08/03/2011 2238      Lab Results  Component Value Date   WBC 7.7 02/13/2013   HGB 12.4 02/13/2013   HCT 38.8 02/13/2013   MCV 74.5 (L) 02/13/2013   PLT 214 02/13/2013   Lab Results  Component Value Date   TSH 1.492 10/25/2014   No results found for: HGBA1C Assessment & Plan:     [name] is a 36 y.o. female here for ***  No problem-specific Assessment & Plan notes found for this encounter.     Erasmo DownerBacigalupo, Si Jachim M, MD MPH PGY-3,  [facility] 10/02/2016  8:32 AM

## 2016-10-16 ENCOUNTER — Other Ambulatory Visit (HOSPITAL_COMMUNITY)
Admission: RE | Admit: 2016-10-16 | Discharge: 2016-10-16 | Disposition: A | Discharge status: Home / Self Care | Payer: 59 | Source: Ambulatory Visit | Attending: Family Medicine | Admitting: Family Medicine

## 2016-10-16 ENCOUNTER — Ambulatory Visit (INDEPENDENT_AMBULATORY_CARE_PROVIDER_SITE_OTHER): Payer: 59 | Admitting: Family Medicine

## 2016-10-16 ENCOUNTER — Encounter: Payer: Self-pay | Admitting: Student

## 2016-10-16 VITALS — BP 102/60 | HR 89 | Temp 98.7°F | Ht 60.0 in | Wt 133.0 lb

## 2016-10-16 DIAGNOSIS — R300 Dysuria: Secondary | ICD-10-CM

## 2016-10-16 DIAGNOSIS — Z113 Encounter for screening for infections with a predominantly sexual mode of transmission: Secondary | ICD-10-CM

## 2016-10-16 DIAGNOSIS — N898 Other specified noninflammatory disorders of vagina: Secondary | ICD-10-CM | POA: Diagnosis not present

## 2016-10-16 LAB — POCT WET PREP (WET MOUNT)
Clue Cells Wet Prep Whiff POC: NEGATIVE
TRICHOMONAS WET PREP HPF POC: ABSENT

## 2016-10-16 LAB — POCT URINALYSIS DIP (MANUAL ENTRY)
Bilirubin, UA: NEGATIVE
Blood, UA: NEGATIVE
Glucose, UA: NEGATIVE mg/dL
Ketones, POC UA: NEGATIVE mg/dL
LEUKOCYTES UA: NEGATIVE
Nitrite, UA: NEGATIVE
PH UA: 7.5 (ref 5.0–8.0)
Protein Ur, POC: NEGATIVE mg/dL
Spec Grav, UA: 1.015 (ref 1.010–1.025)
Urobilinogen, UA: 0.2 E.U./dL

## 2016-10-16 NOTE — Patient Instructions (Signed)

## 2016-10-16 NOTE — Progress Notes (Signed)
   Subjective:   [name] is a 36 y.o. female with a history of Constipation, hemorrhoids, reflux here for same day appointment for  Chief Complaint  Patient presents with  . Urinary Tract Infection  . Vaginal Itching    History is obtained with help of in person montagnard interpreter  DYSURIA  Pain urinating started 7-8 days ago. Pain is: burning which is improving - now more concerned about itching in vagina - initially had Urinary frequency, urgency which have resolved Medications tried: None Any antibiotics in the last 30 days: No More than 3 UTIs in the last 12 months: No STD exposure: No, is sexually active only with her husband, does not use condoms Possibly pregnant: No  Blood in urine: No  Pain in back: No, it feels cold Fever: No Vaginal discharge: She is unsure as she has not looked Mouth Ulcers: No  Review of Symptoms - see HPI PMH - Smoking status noted.     Objective:  BP 102/60   Pulse 89   Temp 98.7 F (37.1 C) (Oral)   Ht 5' (1.524 m)   Wt 133 lb (60.3 kg)   SpO2 98%   BMI 25.97 kg/m   Gen:  36 y.o. female in NAD HEENT: NCAT, MMM, anicteric sclerae CV: Reg rate Resp: Non-labored Abd: Soft, NTND, BS present, no guarding or organomegaly GYN:  External genitalia within normal limits.  Vaginal mucosa pink, moist, normal rugae.  Nonfriable cervix without lesions, no bleeding, thin discharge noted on speculum exam.  Bimanual exam revealed normal, nongravid uterus.  No cervical motion tenderness. No adnexal masses bilaterally.   Ext: WWP, no edema MSK: No obvious deformities, gait intact Neuro: Alert and oriented, speech normal     Urinalysis    Component Value Date/Time   BILIRUBINUR negative 10/16/2016 1045   KETONESUR negative 10/16/2016 1045   PROTEINUR negative 10/16/2016 1045   UROBILINOGEN 0.2 10/16/2016 1045   NITRITE Negative 10/16/2016 1045   LEUKOCYTESUR Negative 10/16/2016 1045     Assessment & Plan:     [name] is  a 36 y.o. female here for   1. Dysuria - POCT urinalysis dipstick - clean, suspect related to vaginitis - POCT Wet Prep [facility] - Cervicovaginal ancillary only  2. Vaginal discharge - POCT Wet Prep [facility]([place]) - Cervicovaginal ancillary only - will call patient with results and treat as indicated  3. Screen for STD (sexually transmitted disease) - HIV antibody - RPR   Beryle FlockBacigalupo, Marzella SchleinAngela M, MD MPH PGY-3,  [facility] 10/16/2016  11:09 AM

## 2016-10-17 LAB — HIV ANTIBODY (ROUTINE TESTING W REFLEX): HIV Screen 4th Generation wRfx: NONREACTIVE

## 2016-10-17 LAB — RPR: RPR: NONREACTIVE

## 2016-10-19 LAB — CERVICOVAGINAL ANCILLARY ONLY
CHLAMYDIA, DNA PROBE: NEGATIVE
Neisseria Gonorrhea: NEGATIVE

## 2016-12-15 ENCOUNTER — Inpatient Hospital Stay (HOSPITAL_COMMUNITY)
Admission: EM | Admit: 2016-12-15 | Discharge: 2016-12-20 | DRG: 872 | Disposition: A | Discharge status: Home / Self Care | Payer: 59 | Attending: Family Medicine | Admitting: Family Medicine

## 2016-12-15 ENCOUNTER — Encounter (HOSPITAL_COMMUNITY): Payer: Self-pay | Admitting: Emergency Medicine

## 2016-12-15 ENCOUNTER — Ambulatory Visit (HOSPITAL_COMMUNITY)
Admission: EM | Admit: 2016-12-15 | Discharge: 2016-12-15 | Disposition: A | Discharge status: Nursing Home (Includes ICF) | Payer: 59

## 2016-12-15 DIAGNOSIS — A4151 Sepsis due to Escherichia coli [E. coli]: Principal | ICD-10-CM | POA: Diagnosis present

## 2016-12-15 DIAGNOSIS — I959 Hypotension, unspecified: Secondary | ICD-10-CM | POA: Diagnosis present

## 2016-12-15 DIAGNOSIS — N10 Acute tubulo-interstitial nephritis: Secondary | ICD-10-CM | POA: Diagnosis not present

## 2016-12-15 DIAGNOSIS — R791 Abnormal coagulation profile: Secondary | ICD-10-CM | POA: Diagnosis present

## 2016-12-15 DIAGNOSIS — R1013 Epigastric pain: Secondary | ICD-10-CM

## 2016-12-15 DIAGNOSIS — E876 Hypokalemia: Secondary | ICD-10-CM

## 2016-12-15 DIAGNOSIS — Z9889 Other specified postprocedural states: Secondary | ICD-10-CM

## 2016-12-15 DIAGNOSIS — K219 Gastro-esophageal reflux disease without esophagitis: Secondary | ICD-10-CM | POA: Diagnosis not present

## 2016-12-15 DIAGNOSIS — R109 Unspecified abdominal pain: Secondary | ICD-10-CM

## 2016-12-15 DIAGNOSIS — E871 Hypo-osmolality and hyponatremia: Secondary | ICD-10-CM | POA: Diagnosis not present

## 2016-12-15 DIAGNOSIS — Z8711 Personal history of peptic ulcer disease: Secondary | ICD-10-CM

## 2016-12-15 DIAGNOSIS — R000 Tachycardia, unspecified: Secondary | ICD-10-CM | POA: Diagnosis not present

## 2016-12-15 DIAGNOSIS — A419 Sepsis, unspecified organism: Secondary | ICD-10-CM

## 2016-12-15 DIAGNOSIS — I248 Other forms of acute ischemic heart disease: Secondary | ICD-10-CM | POA: Diagnosis present

## 2016-12-15 DIAGNOSIS — Z9851 Tubal ligation status: Secondary | ICD-10-CM | POA: Diagnosis not present

## 2016-12-15 DIAGNOSIS — R7881 Bacteremia: Secondary | ICD-10-CM

## 2016-12-15 LAB — COMPREHENSIVE METABOLIC PANEL
ALBUMIN: 3.5 g/dL (ref 3.5–5.0)
ALK PHOS: 66 U/L (ref 38–126)
ALT: 14 U/L (ref 14–54)
AST: 22 U/L (ref 15–41)
Anion gap: 12 (ref 5–15)
BILIRUBIN TOTAL: 1.1 mg/dL (ref 0.3–1.2)
BUN: 10 mg/dL (ref 6–20)
CALCIUM: 8.6 mg/dL — AB (ref 8.9–10.3)
CO2: 21 mmol/L — ABNORMAL LOW (ref 22–32)
CREATININE: 0.77 mg/dL (ref 0.44–1.00)
Chloride: 100 mmol/L — ABNORMAL LOW (ref 101–111)
GFR calc Af Amer: >60 mL/min (ref >60)
GFR calc non Af Amer: >60 mL/min (ref >60)
Glucose, Bld: 99 mg/dL (ref 65–99)
Potassium: 2.9 mmol/L — ABNORMAL LOW (ref 3.5–5.1)
Sodium: 133 mmol/L — ABNORMAL LOW (ref 135–145)
TOTAL PROTEIN: 7.2 g/dL (ref 6.5–8.1)

## 2016-12-15 LAB — I-STAT BETA HCG BLOOD, ED (MC, WL, AP ONLY): HCG, QUANTITATIVE: 17.3 m[IU]/mL — AB (ref <5)

## 2016-12-15 LAB — DIFFERENTIAL
BASOS ABS: 0 10*3/uL (ref 0.0–0.1)
BASOS PCT: 0 %
Eosinophils Absolute: 0 10*3/uL (ref 0.0–0.7)
Eosinophils Relative: 0 %
LYMPHS PCT: 10 %
Lymphs Abs: 1.3 10*3/uL (ref 0.7–4.0)
MONO ABS: 0.7 10*3/uL (ref 0.1–1.0)
MONOS PCT: 5 %
NEUTROS ABS: 11.5 10*3/uL — AB (ref 1.7–7.7)
Neutrophils Relative %: 85 %

## 2016-12-15 LAB — CBC
HCT: 36.6 % (ref 36.0–46.0)
Hemoglobin: 11.9 g/dL — ABNORMAL LOW (ref 12.0–15.0)
MCH: 24.1 pg — ABNORMAL LOW (ref 26.0–34.0)
MCHC: 32.5 g/dL (ref 30.0–36.0)
MCV: 74.2 fL — ABNORMAL LOW (ref 78.0–100.0)
PLATELETS: 157 10*3/uL (ref 150–400)
RBC: 4.93 MIL/uL (ref 3.87–5.11)
RDW: 14.6 % (ref 11.5–15.5)
WBC: 14.8 10*3/uL — ABNORMAL HIGH (ref 4.0–10.5)

## 2016-12-15 LAB — LIPASE, BLOOD: Lipase: 26 U/L (ref 11–51)

## 2016-12-15 MED ORDER — ONDANSETRON HCL 4 MG/2ML IJ SOLN
4.0000 mg | Freq: Once | INTRAMUSCULAR | Status: AC
  Administered 2016-12-16: 4 mg via INTRAVENOUS
  Filled 2016-12-15: qty 2

## 2016-12-15 MED ORDER — SODIUM CHLORIDE 0.9 % IV BOLUS (SEPSIS)
1000.0000 mL | Freq: Once | INTRAVENOUS | Status: AC
  Administered 2016-12-15: 1000 mL via INTRAVENOUS

## 2016-12-15 MED ORDER — POTASSIUM CHLORIDE 10 MEQ/100ML IV SOLN
10.0000 meq | Freq: Once | INTRAVENOUS | Status: AC
  Administered 2016-12-16: 10 meq via INTRAVENOUS
  Filled 2016-12-15: qty 100

## 2016-12-15 MED ORDER — MORPHINE SULFATE (PF) 4 MG/ML IV SOLN
4.0000 mg | Freq: Once | INTRAVENOUS | Status: AC
  Administered 2016-12-16: 4 mg via INTRAVENOUS
  Filled 2016-12-15: qty 1

## 2016-12-15 NOTE — ED Notes (Signed)
Dr Glick in room. 

## 2016-12-15 NOTE — ED Triage Notes (Signed)
Pt c/o epigastric pain with nausea and vomiting.  Onset yesterday.  St's she can't keep water down

## 2016-12-15 NOTE — ED Provider Notes (Signed)
[facility]-EMERGENCY DEPT Provider Note   CSN: [id number] Arrival date & time: 12/15/16  1924     History   Chief Complaint Chief Complaint  Patient presents with  . Abdominal Pain  . Emesis    HPI [name] is a 36 y.o. female.  The history is provided by the patient. A language interpreter was used.  Abdominal Pain   Associated symptoms include vomiting.  Emesis   Associated symptoms include abdominal pain.  Of epigastric pain with radiation to her left shoulder. There is no radiation to the back. She has had subjective fever with chills. There is associated nausea and vomiting. She's been unable to hold anything down. There's been no constipation or diarrhea. She's had no known sick contacts. She has not treated herself with anything at home. She does have known history of GERD.  Past Medical History:  Diagnosis Date  . GERD (gastroesophageal reflux disease)   . Hemorrhoids   . Pap smear for cervical cancer screening 09/02/2011    Patient Active Problem List   Diagnosis Date Noted  . Constipation 10/25/2014  . Hemorrhoids 10/25/2014  . Allergic conjunctivitis and rhinitis 09/05/2014  . 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 Family planning, BCP (birth control pills) maintenance 06/06/2012  . Birth control counseling 06/06/2012  . Pyelonephritis 10/28/2011  . Pap smear for cervical cancer screening 09/02/2011  . Gastritis 08/07/2011  . Gastric ulcer 06/25/2011  . Reflux 04/15/2011  . Lumbago 03/20/2010    Past Surgical History:  Procedure Laterality Date  . BILATERAL SALPINGECTOMY Bilateral 02/13/2013   Procedure: BILATERAL SALPINGECTOMY;  Surgeon: Purcell Nails, MD;  Location: [facility];  Service: Gynecology;  Laterality: Bilateral;  . CESAREAN SECTION    . ESOPHAGOGASTRODUODENOSCOPY  09/29/2011   Procedure: ESOPHAGOGASTRODUODENOSCOPY (EGD);  Surgeon: Petra Kuba, MD;  Location: [facility];  Service: Endoscopy;  Laterality: N/A;  . LAPAROSCOPIC BILATERAL  SALPINGECTOMY Bilateral 02/13/2013   Procedure: LAPAROSCOPIC BILATERAL SALPINGECTOMY VIA FULGERATION;  Surgeon: Purcell Nails, MD;  Location: [facility];  Service: Gynecology;  Laterality: Bilateral;  Laparoscopic BTL via Fulguration/ possible Bilateral Salpingectomy    OB History    No data available       Home Medications    Prior to Admission medications   Medication Sig Start Date End Date Taking? Authorizing Provider  cetirizine (ZYRTEC) 10 MG tablet Take 1 tablet (10 mg total) by mouth daily. 09/05/14   Abram Sander, MD  hydrocortisone (ANUSOL-HC) 2.5 % rectal cream Place 1 application rectally 3 (three) times daily. Use for 1 week, 3 times a day for until your symptoms have resolved. 10/25/14   Kuneff, Renee A, DO  Olopatadine HCl (PATADAY) 0.2 % SOLN Place 1 drop into both eyes daily. 09/05/14   Abram Sander, MD  omeprazole (PRILOSEC) 20 MG capsule Take 1 capsule (20 mg total) by mouth 2 (two) times daily. 08/07/16   Freddrick March, MD  polyethylene glycol powder (GLYCOLAX/MIRALAX) powder Take 17 g by mouth daily. 10/25/14   Felix Pacini A, DO    Family History No family history on file.  Social History Social History  Substance Use Topics  . Smoking status: Never Smoker  . Smokeless tobacco: Never Used  . Alcohol use No     Allergies   Patient has no known allergies.   Review of Systems Review of Systems  Gastrointestinal: Positive for abdominal pain and vomiting.  All other systems reviewed and are negative.    Physical Exam Updated Vital Signs  BP (!) 102/53 (BP Location: Left Arm)   Pulse 87   Temp 99.5 F (37.5 C) (Oral)   Resp 18   Ht 5\' 2"  (1.575 m)   Wt 56.7 kg (125 lb)   LMP 10/15/2016   SpO2 100%   BMI 22.86 kg/m   Physical Exam  Nursing note and vitals reviewed.  36 year old female, resting comfortably and in no acute distress. Vital signs are normal. Oxygen saturation is 100%, which is normal. Head is normocephalic and atraumatic. PERRLA,  EOMI. mucous membranes are dry. Neck is nontender and supple without adenopathy or JVD. Back is nontender and there is no CVA tenderness. Lungs are clear without rales, wheezes, or rhonchi. Chest is nontender. Heart has regular rate and rhythm with 2/6 systolic ejection murmur best heard along the left sternal border. Abdomen is soft, flat, with mild to moderate tenderness in the epigastric area. Aorta is palpable but nontender. There is no rebound or guarding. There are no masses or hepatosplenomegaly and peristalsis is hypoactive. Extremities have no cyanosis or edema, full range of motion is present. Skin is warm and dry without rash. Neurologic: Mental status is normal, cranial nerves are intact, there are no motor or sensory deficits.  ED Treatments / Results  Labs (all labs ordered are listed, but only abnormal results are displayed) Labs Reviewed  COMPREHENSIVE METABOLIC PANEL - Abnormal; Notable for the following:       Result Value   Sodium 133 (*)    Potassium 2.9 (*)    Chloride 100 (*)    CO2 21 (*)    Calcium 8.6 (*)    All other components within normal limits  CBC - Abnormal; Notable for the following:    WBC 14.8 (*)    Hemoglobin 11.9 (*)    MCV 74.2 (*)    MCH 24.1 (*)    All other components within normal limits  URINALYSIS, ROUTINE W REFLEX MICROSCOPIC - Abnormal; Notable for the following:    Hgb urine dipstick MODERATE (*)    Ketones, ur 80 (*)    Leukocytes, UA MODERATE (*)    Bacteria, UA MANY (*)    Squamous Epithelial / LPF 0-5 (*)    All other components within normal limits  HCG, QUANTITATIVE, PREGNANCY - Abnormal; Notable for the following:    hCG, Beta Chain, Quant, S 5 (*)    All other components within normal limits  DIFFERENTIAL - Abnormal; Notable for the following:    Neutro Abs 11.5 (*)    All other components within normal limits  I-STAT BETA HCG BLOOD, ED ([facility], [facility], [facility] ONLY) - Abnormal; Notable for the following:    I-stat hCG,  quantitative 17.3 (*)    All other components within normal limits  CULTURE, BLOOD (ROUTINE X 2)  CULTURE, BLOOD (ROUTINE X 2)  URINE CULTURE  LIPASE, BLOOD  I-STAT CG4 LACTIC ACID, ED  POC URINE PREG, ED   Radiology Ct Abdomen Pelvis W Contrast  Result Date: 12/16/2016 CLINICAL DATA:  Abdominal pain nausea and vomiting EXAM: CT ABDOMEN AND PELVIS WITH CONTRAST TECHNIQUE: Multidetector CT imaging of the abdomen and pelvis was performed using the standard protocol following bolus administration of intravenous contrast. CONTRAST:  ISOVUE-300 IOPAMIDOL (ISOVUE-300) INJECTION 61% COMPARISON:  03/12/2008 FINDINGS: Lower chest: Lung bases demonstrate no acute consolidation or effusion. Normal heart size. Hepatobiliary: No focal liver abnormality is seen. No gallstones, gallbladder wall thickening, or biliary dilatation. Pancreas: Unremarkable. No pancreatic ductal dilatation or surrounding inflammatory  changes. Spleen: Normal in size without focal abnormality. Adrenals/Urinary Tract: Adrenal glands are within normal limits. Left perinephric fat stranding. Heterogenous hypodensities within the left renal cortex. Mild urothelial enhancement on the left with slight prominence of left ureter. Bladder unremarkable. Stomach/Bowel: Stomach is within normal limits. Appendix appears normal. No evidence of bowel wall thickening, distention, or inflammatory changes. Vascular/Lymphatic: No significant vascular findings are present. No enlarged abdominal or pelvic lymph nodes. Reproductive: Uterus and bilateral adnexa are unremarkable. Other:  Negative for free air or free fluid. Musculoskeletal: Stable 7 mm anterolisthesis of L4 on L5. 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 IMPRESSION: 1. Left perinephric edema with heterogenous enhancement of left kidney and multifocal cortical hypodensity, collective findings are concerning for an acute pyelonephritis. Slight prominence of left ureter with urothelial  enhancement, no definite stones seen along course of left ureter. 2. Negative appendix 3. Stable anterolisthesis of L4 on L5 with chronic appearing pars defects at L4 and L5 Electronically Signed   By: Jasmine Pang M.D.   On: 12/16/2016 02:18    Procedures Procedures (including critical care time)  Medications Ordered in ED Medications  piperacillin-tazobactam (ZOSYN) IVPB 3.375 g (not administered)  ondansetron (ZOFRAN) injection 4 mg (4 mg Intravenous Given 12/16/16 0043)  morphine 4 MG/ML injection 4 mg (4 mg Intravenous Given 12/16/16 0043)  sodium chloride 0.9 % bolus 1,000 mL (0 mLs Intravenous Stopped 12/16/16 0010)  potassium chloride 10 mEq in 100 mL IVPB (0 mEq Intravenous Stopped 12/16/16 0144)  piperacillin-tazobactam (ZOSYN) IVPB 3.375 g (3.375 g Intravenous New Bag/Given 12/16/16 0218)  acetaminophen (TYLENOL) tablet 650 mg (650 mg Oral Given 12/16/16 0215)  iopamidol (ISOVUE-300) 61 % injection 100 mL (100 mLs Intravenous Contrast Given 12/16/16 0146)     Initial Impression / Assessment and Plan / ED Course  I have reviewed the triage vital signs and the nursing notes.  Pertinent labs & imaging results that were available during my care of the patient were reviewed by me and considered in my medical decision making (see chart for details).  Abdominal pain with nausea and vomiting. This could be a viral gastritis, but I am concerned about pain radiating to the shoulder of which could be sign of perforated viscus. I-STAT hCG is noted to be mildly elevated, but doubt pregnancy since she has history of tubal ligation. Significant hypokalemia is noted consistent with multiple episodes of emesis. She will be given IV potassium as well as IV fluids. She'll be sent for CT of abdomen and pelvis. Old records are reviewed, and she has no relevant past visits. She does have history of having had normal CT of abdomen and pelvis in 2009 (with exception of bilateral pars defects at L4 and L5 with 5  mm anterolisthesis of L5 on S1), and normal abdominal ultrasound in 2013.  I-STAT hCG has come back mildly elevated. Quantitative hCG was obtained and is borderline at 5. She is status post tubal ligation, so no chance of intrauterine pregnancy. Radiology and cyst patient sign waver in order to proceed with CT scan. Findings were explained to the patient including their implications of possible very early ectopic pregnancy. Also, she has developed fever to 102.4. Labs are obtained for sepsis protocol and she is started on antibiotics for presumed intra-abdominal source of infection.  CT shows findings suggestive of pyelonephritis. Urinalysis has come back with too numerous to count WBCs and many bacteria. Initial antibiotics are probably adequate for urinary tract infection. Lactic acid level is normal-no sign  of severe sepsis. Case is discussed with Dr. Janee Mornhompson of family practice service who agrees to admit the patient.  Final Clinical Impressions(s) / ED Diagnoses   Final diagnoses:  Pyelonephritis, acute  Hypokalemia  Hyponatremia    New Prescriptions New Prescriptions   No medications on file     Dione BoozeGlick, Cassie Henkels, MD 12/16/16 [phone]

## 2016-12-16 ENCOUNTER — Inpatient Hospital Stay (HOSPITAL_COMMUNITY): Payer: 59

## 2016-12-16 ENCOUNTER — Emergency Department (HOSPITAL_COMMUNITY): Payer: 59

## 2016-12-16 DIAGNOSIS — A4151 Sepsis due to Escherichia coli [E. coli]: Secondary | ICD-10-CM | POA: Diagnosis not present

## 2016-12-16 DIAGNOSIS — Z9889 Other specified postprocedural states: Secondary | ICD-10-CM | POA: Diagnosis not present

## 2016-12-16 DIAGNOSIS — R748 Abnormal levels of other serum enzymes: Secondary | ICD-10-CM | POA: Diagnosis not present

## 2016-12-16 DIAGNOSIS — Z9851 Tubal ligation status: Secondary | ICD-10-CM | POA: Diagnosis not present

## 2016-12-16 DIAGNOSIS — A419 Sepsis, unspecified organism: Secondary | ICD-10-CM | POA: Diagnosis not present

## 2016-12-16 DIAGNOSIS — E871 Hypo-osmolality and hyponatremia: Secondary | ICD-10-CM | POA: Diagnosis present

## 2016-12-16 DIAGNOSIS — R9431 Abnormal electrocardiogram [ECG] [EKG]: Secondary | ICD-10-CM | POA: Diagnosis not present

## 2016-12-16 DIAGNOSIS — R1013 Epigastric pain: Secondary | ICD-10-CM

## 2016-12-16 DIAGNOSIS — K219 Gastro-esophageal reflux disease without esophagitis: Secondary | ICD-10-CM | POA: Diagnosis present

## 2016-12-16 DIAGNOSIS — N10 Acute tubulo-interstitial nephritis: Secondary | ICD-10-CM | POA: Diagnosis not present

## 2016-12-16 DIAGNOSIS — R7989 Other specified abnormal findings of blood chemistry: Secondary | ICD-10-CM | POA: Diagnosis not present

## 2016-12-16 DIAGNOSIS — R791 Abnormal coagulation profile: Secondary | ICD-10-CM | POA: Diagnosis present

## 2016-12-16 DIAGNOSIS — R109 Unspecified abdominal pain: Secondary | ICD-10-CM | POA: Diagnosis not present

## 2016-12-16 DIAGNOSIS — R7881 Bacteremia: Secondary | ICD-10-CM | POA: Diagnosis not present

## 2016-12-16 DIAGNOSIS — I248 Other forms of acute ischemic heart disease: Secondary | ICD-10-CM | POA: Diagnosis present

## 2016-12-16 DIAGNOSIS — R000 Tachycardia, unspecified: Secondary | ICD-10-CM | POA: Diagnosis present

## 2016-12-16 DIAGNOSIS — E876 Hypokalemia: Secondary | ICD-10-CM

## 2016-12-16 DIAGNOSIS — R112 Nausea with vomiting, unspecified: Secondary | ICD-10-CM | POA: Diagnosis not present

## 2016-12-16 DIAGNOSIS — Z8711 Personal history of peptic ulcer disease: Secondary | ICD-10-CM | POA: Diagnosis not present

## 2016-12-16 DIAGNOSIS — R1110 Vomiting, unspecified: Secondary | ICD-10-CM | POA: Diagnosis not present

## 2016-12-16 DIAGNOSIS — I959 Hypotension, unspecified: Secondary | ICD-10-CM | POA: Diagnosis present

## 2016-12-16 LAB — CBC
HCT: 34.2 % — ABNORMAL LOW (ref 36.0–46.0)
Hemoglobin: 11 g/dL — ABNORMAL LOW (ref 12.0–15.0)
MCH: 23.7 pg — AB (ref 26.0–34.0)
MCHC: 32.2 g/dL (ref 30.0–36.0)
MCV: 73.7 fL — AB (ref 78.0–100.0)
PLATELETS: 133 10*3/uL — AB (ref 150–400)
RBC: 4.64 MIL/uL (ref 3.87–5.11)
RDW: 14.5 % (ref 11.5–15.5)
WBC: 12.4 10*3/uL — ABNORMAL HIGH (ref 4.0–10.5)

## 2016-12-16 LAB — BASIC METABOLIC PANEL
Anion gap: 9 (ref 5–15)
Anion gap: 10 (ref 5–15)
Anion gap: 8 (ref 5–15)
BUN: 6 mg/dL (ref 6–20)
BUN: 6 mg/dL (ref 6–20)
CALCIUM: 7.9 mg/dL — AB (ref 8.9–10.3)
CALCIUM: 7.9 mg/dL — AB (ref 8.9–10.3)
CHLORIDE: 107 mmol/L (ref 101–111)
CO2: 22 mmol/L (ref 22–32)
CO2: 20 mmol/L — ABNORMAL LOW (ref 22–32)
CO2: 20 mmol/L — AB (ref 22–32)
CREATININE: 0.75 mg/dL (ref 0.44–1.00)
CREATININE: 0.75 mg/dL (ref 0.44–1.00)
Calcium: 7.4 mg/dL — ABNORMAL LOW (ref 8.9–10.3)
Chloride: 108 mmol/L (ref 101–111)
Chloride: 112 mmol/L — ABNORMAL HIGH (ref 101–111)
Creatinine, Ser: 0.76 mg/dL (ref 0.44–1.00)
GFR calc Af Amer: >60 mL/min (ref >60)
GFR calc Af Amer: >60 mL/min (ref >60)
GFR calc non Af Amer: >60 mL/min (ref >60)
GFR calc non Af Amer: >60 mL/min (ref >60)
GLUCOSE: 108 mg/dL — AB (ref 65–99)
GLUCOSE: 109 mg/dL — AB (ref 65–99)
Glucose, Bld: 101 mg/dL — ABNORMAL HIGH (ref 65–99)
POTASSIUM: 3.3 mmol/L — AB (ref 3.5–5.1)
Potassium: 2.7 mmol/L — CL (ref 3.5–5.1)
Potassium: 3.3 mmol/L — ABNORMAL LOW (ref 3.5–5.1)
SODIUM: 138 mmol/L (ref 135–145)
Sodium: 138 mmol/L (ref 135–145)
Sodium: 140 mmol/L (ref 135–145)

## 2016-12-16 LAB — BLOOD CULTURE ID PANEL (REFLEXED)
Acinetobacter baumannii: NOT DETECTED
CANDIDA KRUSEI: NOT DETECTED
Candida albicans: NOT DETECTED
Candida glabrata: NOT DETECTED
Candida parapsilosis: NOT DETECTED
Candida tropicalis: NOT DETECTED
Carbapenem resistance: NOT DETECTED
ESCHERICHIA COLI: DETECTED — AB
Enterobacter cloacae complex: NOT DETECTED
Enterobacteriaceae species: DETECTED — AB
Enterococcus species: NOT DETECTED
Haemophilus influenzae: NOT DETECTED
KLEBSIELLA OXYTOCA: NOT DETECTED
Klebsiella pneumoniae: NOT DETECTED
Listeria monocytogenes: NOT DETECTED
METHICILLIN RESISTANCE: NOT DETECTED
NEISSERIA MENINGITIDIS: NOT DETECTED
PROTEUS SPECIES: NOT DETECTED
PSEUDOMONAS AERUGINOSA: NOT DETECTED
SERRATIA MARCESCENS: NOT DETECTED
STAPHYLOCOCCUS AUREUS BCID: NOT DETECTED
STAPHYLOCOCCUS SPECIES: NOT DETECTED
STREPTOCOCCUS AGALACTIAE: NOT DETECTED
STREPTOCOCCUS SPECIES: NOT DETECTED
Streptococcus pneumoniae: NOT DETECTED
Streptococcus pyogenes: NOT DETECTED
Vancomycin resistance: NOT DETECTED

## 2016-12-16 LAB — TROPONIN I
Troponin I: 0.05 ng/mL (ref <0.03)
Troponin I: 0.04 ng/mL (ref <0.03)
Troponin I: 0.07 ng/mL (ref <0.03)
Troponin I: 0.04 ng/mL (ref <0.03)

## 2016-12-16 LAB — URINALYSIS, ROUTINE W REFLEX MICROSCOPIC
BILIRUBIN URINE: NEGATIVE
GLUCOSE, UA: NEGATIVE mg/dL
Ketones, ur: 80 mg/dL — AB
Nitrite: NEGATIVE
PROTEIN: NEGATIVE mg/dL
Specific Gravity, Urine: 1.01 (ref 1.005–1.030)
pH: 8 (ref 5.0–8.0)

## 2016-12-16 LAB — HCG, QUANTITATIVE, PREGNANCY
HCG, BETA CHAIN, QUANT, S: 5 m[IU]/mL — AB (ref <5)
HCG, BETA CHAIN, QUANT, S: 2 m[IU]/mL (ref <5)

## 2016-12-16 LAB — MAGNESIUM: Magnesium: 1.5 mg/dL — ABNORMAL LOW (ref 1.7–2.4)

## 2016-12-16 LAB — D-DIMER, QUANTITATIVE (NOT AT ARMC): D DIMER QUANT: 1.86 ug{FEU}/mL — AB (ref 0.00–0.50)

## 2016-12-16 LAB — PHOSPHORUS: PHOSPHORUS: 3 mg/dL (ref 2.5–4.6)

## 2016-12-16 LAB — I-STAT CG4 LACTIC ACID, ED: LACTIC ACID, VENOUS: 0.93 mmol/L (ref 0.5–1.9)

## 2016-12-16 LAB — OCCULT BLOOD X 1 CARD TO LAB, STOOL: Fecal Occult Bld: POSITIVE — AB

## 2016-12-16 MED ORDER — SODIUM CHLORIDE 0.9 % IV BOLUS (SEPSIS)
1000.0000 mL | Freq: Once | INTRAVENOUS | Status: AC
  Administered 2016-12-16: 1000 mL via INTRAVENOUS

## 2016-12-16 MED ORDER — VANCOMYCIN HCL IN DEXTROSE 750-5 MG/150ML-% IV SOLN
750.0000 mg | Freq: Two times a day (BID) | INTRAVENOUS | Status: DC
  Filled 2016-12-16: qty 150

## 2016-12-16 MED ORDER — ENOXAPARIN SODIUM 40 MG/0.4ML ~~LOC~~ SOLN: 40.0000 mg | SUBCUTANEOUS | Status: DC

## 2016-12-16 MED ORDER — POTASSIUM CHLORIDE 10 MEQ/100ML IV SOLN: 10.0000 meq | INTRAVENOUS | Status: DC

## 2016-12-16 MED ORDER — POTASSIUM CHLORIDE 20 MEQ PO PACK: 40.0000 meq | PACK | Freq: Two times a day (BID) | ORAL | Status: DC

## 2016-12-16 MED ORDER — IOPAMIDOL (ISOVUE-370) INJECTION 76%
INTRAVENOUS | Status: AC
  Administered 2016-12-16: 70 mL
  Filled 2016-12-16: qty 100

## 2016-12-16 MED ORDER — IBUPROFEN 400 MG PO TABS
200.0000 mg | ORAL_TABLET | Freq: Four times a day (QID) | ORAL | Status: DC | PRN
  Administered 2016-12-16 – 2016-12-19 (×4): 200 mg via ORAL
  Filled 2016-12-16 (×4): qty 1

## 2016-12-16 MED ORDER — POTASSIUM CHLORIDE 10 MEQ/100ML IV SOLN
10.0000 meq | INTRAVENOUS | Status: AC
  Administered 2016-12-16 (×2): 10 meq via INTRAVENOUS
  Filled 2016-12-16 (×2): qty 100

## 2016-12-16 MED ORDER — PIPERACILLIN-TAZOBACTAM 3.375 G IVPB 30 MIN
3.3750 g | Freq: Once | INTRAVENOUS | Status: AC
  Administered 2016-12-16: 3.375 g via INTRAVENOUS
  Filled 2016-12-16: qty 50

## 2016-12-16 MED ORDER — POTASSIUM CHLORIDE 10 MEQ/100ML IV SOLN
10.0000 meq | INTRAVENOUS | Status: AC
  Administered 2016-12-16 – 2016-12-17 (×3): 10 meq via INTRAVENOUS
  Filled 2016-12-16 (×3): qty 100

## 2016-12-16 MED ORDER — IOPAMIDOL (ISOVUE-300) INJECTION 61%
100.0000 mL | Freq: Once | INTRAVENOUS | Status: AC | PRN
  Administered 2016-12-16: 100 mL via INTRAVENOUS

## 2016-12-16 MED ORDER — POTASSIUM CHLORIDE CRYS ER 20 MEQ PO TBCR
40.0000 meq | EXTENDED_RELEASE_TABLET | Freq: Two times a day (BID) | ORAL | Status: DC
  Administered 2016-12-16: 40 meq via ORAL
  Filled 2016-12-16: qty 2

## 2016-12-16 MED ORDER — POTASSIUM CHLORIDE 10 MEQ/100ML IV SOLN
10.0000 meq | Freq: Once | INTRAVENOUS | Status: AC
  Administered 2016-12-16: 10 meq via INTRAVENOUS
  Filled 2016-12-16: qty 100

## 2016-12-16 MED ORDER — PIPERACILLIN-TAZOBACTAM 3.375 G IVPB
3.3750 g | Freq: Three times a day (TID) | INTRAVENOUS | Status: DC
  Administered 2016-12-16 (×2): 3.375 g via INTRAVENOUS
  Filled 2016-12-16 (×4): qty 50

## 2016-12-16 MED ORDER — ACETAMINOPHEN 325 MG PO TABS
650.0000 mg | ORAL_TABLET | Freq: Once | ORAL | Status: AC
  Administered 2016-12-16: 650 mg via ORAL
  Filled 2016-12-16: qty 2

## 2016-12-16 MED ORDER — VANCOMYCIN HCL IN DEXTROSE 1-5 GM/200ML-% IV SOLN
1000.0000 mg | Freq: Once | INTRAVENOUS | Status: AC
  Administered 2016-12-16: 1000 mg via INTRAVENOUS
  Filled 2016-12-16: qty 200

## 2016-12-16 MED ORDER — CEFTRIAXONE SODIUM 2 G IJ SOLR
2.0000 g | INTRAMUSCULAR | Status: DC
  Administered 2016-12-16 – 2016-12-18 (×3): 2 g via INTRAVENOUS
  Filled 2016-12-16 (×3): qty 2

## 2016-12-16 MED ORDER — SODIUM CHLORIDE 0.9% FLUSH
3.0000 mL | Freq: Two times a day (BID) | INTRAVENOUS | Status: DC
  Administered 2016-12-16 – 2016-12-20 (×6): 3 mL via INTRAVENOUS

## 2016-12-16 MED ORDER — ONDANSETRON HCL 4 MG/2ML IJ SOLN
4.0000 mg | Freq: Three times a day (TID) | INTRAMUSCULAR | Status: DC
  Administered 2016-12-16 – 2016-12-20 (×10): 4 mg via INTRAVENOUS
  Filled 2016-12-16 (×13): qty 2

## 2016-12-16 MED ORDER — ACETAMINOPHEN 325 MG PO TABS
650.0000 mg | ORAL_TABLET | Freq: Four times a day (QID) | ORAL | Status: DC | PRN
  Administered 2016-12-16 – 2016-12-18 (×6): 650 mg via ORAL
  Filled 2016-12-16 (×6): qty 2

## 2016-12-16 MED ORDER — IOPAMIDOL (ISOVUE-300) INJECTION 61%
INTRAVENOUS | Status: AC
  Filled 2016-12-16: qty 100

## 2016-12-16 MED ORDER — SODIUM CHLORIDE 0.9 % IV SOLN
INTRAVENOUS | Status: DC
  Administered 2016-12-16 – 2016-12-18 (×5): via INTRAVENOUS
  Administered 2016-12-19: 100 mL/h via INTRAVENOUS
  Administered 2016-12-20: 05:00:00 via INTRAVENOUS

## 2016-12-16 NOTE — Progress Notes (Signed)
Pharmacy Antibiotic Note  [name] is a 36 y.o. female admitted on 12/15/2016 with intra-abdominal infection.  Pharmacy has been consulted for Zosyn dosing.  Plan: Zosyn 3.375g IV q8h (4 hour infusion).  Height: 5\' 2"  (157.5 cm) Weight: 125 lb (56.7 kg) IBW/kg (Calculated) : 50.1  Temp (24hrs), Avg:98.8 F (37.1 C), Min:98 F (36.7 C), Max:99.5 F (37.5 C)   Recent Labs Lab 12/15/16 1942  WBC 14.8*  CREATININE 0.77    Estimated Creatinine Clearance: 75.4 mL/min (by C-G formula based on SCr of 0.77 mg/dL).    No Known Allergies   Thank you for allowing pharmacy to be a part of this patient's care.  Vernard GamblesVeronda Margarita Croke, PharmD, BCPS  12/16/2016 12:51 AM

## 2016-12-16 NOTE — ED Notes (Signed)
Blanket removed from patient.  Explained why blanket removed - patients face flushed.  CT requests that Dr Preston FleetingGlick talks to patient regarding positive preg and getting a CT.

## 2016-12-16 NOTE — Progress Notes (Signed)
FPTS Interim Progress Note  S:Pt denies any chest pain, abdominal pain, SOB, difficulty breathing. O: BP (!) 82/54 (BP Location: Left Arm)   Pulse 100   Temp 98.2 F (36.8 C) (Oral)   Resp 18   Ht 5\' 2"  (1.575 m)   Wt 125 lb (56.7 kg)   LMP 10/15/2016   SpO2 97%   BMI 22.86 kg/m    PE:  Gen: resting comfortably in bed, awake  CV: nl s1/s2, no mrg Lung: CTAB, no increased work of breathing  A/P: Pt remains asymptomatic w/ no s/s of respiratory distress. But pt has elevated d-dimer @ 1.86, persistent hypotension 80/50 (checked in both arms), elevated troponin at 0.05, ekg sinus tach although improved potassium 2.7 -concern for PE, will order CTA chest -will replete potassium w/ IV KCL  Garnette Gunnerhompson, Dajai Wahlert B, MD 12/16/2016, 5:53 AM PGY-1, [facility] pager [phone]

## 2016-12-16 NOTE — ED Notes (Signed)
Spoke with patient and daughter regarding needing to stay in the hospital.  Patient states she wants to go home that she is feeling better.  Explained the need for antibitotics

## 2016-12-16 NOTE — Progress Notes (Signed)
Provider text paged because patient was given tylenol for a headache at 1:05 pm and now has increased in pain along with increase in tempeture. Provider wants Zofran and Ibuprofen given.

## 2016-12-16 NOTE — ED Notes (Signed)
Patient ambulated to the BR without c/o dizziness.

## 2016-12-16 NOTE — Progress Notes (Signed)
Paged provider because potassium was 3.3 and additional potassium run is ordered.

## 2016-12-16 NOTE — Progress Notes (Signed)
Pharmacy Antibiotic Note  [name] is a 36 y.o. female admitted on 12/15/2016 with sepsis secondary to abdominal invection vs pyelonephritis.  Patient was initially started on Zosyn.  Pharmacy has been consulted for Vancomyinc dosing as well.  Plan: Vancomycin 1gm IV x 1 followed by Vancomycin 750mg   IV every 12 hours.  Goal trough 15-20 mcg/mL.  Height: 5\' 2"  (157.5 cm) Weight: 125 lb (56.7 kg) IBW/kg (Calculated) : 50.1  Temp (24hrs), Avg:99.5 F (37.5 C), Min:98 F (36.7 C), Max:102.4 F (39.1 C)   Recent Labs Lab 12/15/16 1942 12/16/16 0114 12/16/16 0411 12/16/16 0830  WBC 14.8*  --  12.4*  --   CREATININE 0.77  --  0.75 0.76  LATICACIDVEN  --  0.93  --   --     Estimated Creatinine Clearance: 75.4 mL/min (by C-G formula based on SCr of 0.76 mg/dL).    No Known Allergies  Antimicrobials this admission: Zosyn 8/15 >> Vanc 8/15 >>   Dose adjustments this admission:   Microbiology results: 8/15 BCx: pending 8/15 UCx: pending  Thank you for allowing pharmacy to be a part of this patient's care.  Toys 'R' UsKimberly Glenwood Revoir, Pharm.D., BCPS Clinical Pharmacist Pager: [phone] Clinical phone for 12/16/2016 from 8:30-4:00 is [phone]. After 4pm, please call Main Rx [phone]) for assistance. 12/16/2016 11:33 AM

## 2016-12-16 NOTE — Progress Notes (Signed)
PHARMACY - PHYSICIAN COMMUNICATION CRITICAL VALUE ALERT - BLOOD CULTURE IDENTIFICATION (BCID)  Results for orders placed or performed during the hospital encounter of 12/15/16  Blood Culture ID Panel (Reflexed) (Collected: 12/15/2016 11:55 PM)  Result Value Ref Range   Enterococcus species NOT DETECTED NOT DETECTED   Vancomycin resistance NOT DETECTED NOT DETECTED   Listeria monocytogenes NOT DETECTED NOT DETECTED   Staphylococcus species NOT DETECTED NOT DETECTED   Staphylococcus aureus NOT DETECTED NOT DETECTED   Methicillin resistance NOT DETECTED NOT DETECTED   Streptococcus species NOT DETECTED NOT DETECTED   Streptococcus agalactiae NOT DETECTED NOT DETECTED   Streptococcus pneumoniae NOT DETECTED NOT DETECTED   Streptococcus pyogenes NOT DETECTED NOT DETECTED   Acinetobacter baumannii NOT DETECTED NOT DETECTED   Enterobacteriaceae species DETECTED (A) NOT DETECTED   Enterobacter cloacae complex NOT DETECTED NOT DETECTED   Escherichia coli DETECTED (A) NOT DETECTED   Klebsiella oxytoca NOT DETECTED NOT DETECTED   Klebsiella pneumoniae NOT DETECTED NOT DETECTED   Proteus species NOT DETECTED NOT DETECTED   Serratia marcescens NOT DETECTED NOT DETECTED   Carbapenem resistance NOT DETECTED NOT DETECTED   Haemophilus influenzae NOT DETECTED NOT DETECTED   Neisseria meningitidis NOT DETECTED NOT DETECTED   Pseudomonas aeruginosa NOT DETECTED NOT DETECTED   Candida albicans NOT DETECTED NOT DETECTED   Candida glabrata NOT DETECTED NOT DETECTED   Candida krusei NOT DETECTED NOT DETECTED   Candida parapsilosis NOT DETECTED NOT DETECTED   Candida tropicalis NOT DETECTED NOT DETECTED    Name of physician (or Provider) Contacted: FPTS via text page  Changes to prescribed antibiotics required: Streamline to Ceftriaxone 2 grams IV Q24  Elwin Sleightowell, Jocelin Schuelke Kay 12/16/2016  6:03 PM

## 2016-12-16 NOTE — H&P (Signed)
Family Medicine Teaching [facility] Admission History and Physical Service Pager: [phone]  Patient name: [name] Medical record number: [id number] Date of birth: 1981/04/11 Age: 36 y.o. Gender: female  Primary Care Provider: Renne Musca, MD Consultants: None Code Status: Full  Chief Complaint: abdominal pain w/ vomiting  Assessment and Plan: [name] is a 36 y.o. female presenting with 2 day h/o worsening epigastric abdominal pain with emesis and fever. PMH is significant for Gastric Ulcer, Lumbago, Pars defect of lumbar spine.   Abdominal pain: Pt presented to the ED for abdominal pain radiating to back, right shoulder. She was afebrile, hypotensive. Also febrile to 102.4 and tachycardic 110s, BP 80s/60s. Code Sepsis was initiated and patient was started on Zosyn, given 1L NS bolus. Labs significant for leukocytosis of 14.8, lactic acid 0.93, hyponatremia 133, hypokalemia 2.9. UA showed many bacteria, 80 ketones, moderate leukocytes. bHCG quant 5. Abdominal CT showed Left perinephric edema with heterogenous enhancement of left kidney and multifocal cortical hypodensity, collective findings are concerning for an acute pyelonephritis. Pt 4 mg of morphine in ED with excellent pain control. Most likely pyelo, differential also includes ACS, PE, perforated viscus (ruled out via CT abd/pelvis), ectopic pregnancy, GERD. Will treat pyelo with zosyn given severity of presentation, narrow as urine culture allows. Wells score PE only 1.5 due to tachycardia, but cannot rule out with PERC criteria. Will order stat D dimer. Low likelihood ACS, but as women can present atypically, will get EKG and troponins. EKG with new QT prolongation.  -admit to the FPTS, attending Dr. Lum Babe -Blood cultures x2 collected  -Zosyn per pharmacy -Urine Culture sent -trend Troponins -Zofran PRN -Tylenol and Ibuprofen prn -2 L NS bolus -D-dimer -Cardiac monitoring -follow bp -trend WBC  Elevated  bHCG. Mildly elevated on admission at 5. Pt is s/p tubal ligation. Last sexual intercouse 1 week ago. Pt reports not having periods for past 4 years. Uterus was observed on CT abdomen. Pt not likely pregnant given low levels, but bHCG elevation concerning for possible ectopic pregnancy or underlying gestational trophoblastic disease. E coli sepsis can also cause false positive Beta Hcg. Will closely monitor abdominal exams and repeat beta in 48 hours. Would have low threshold to consider transvaginal US if change in abdominal exam or increased beta Hcg. Discussed with Dr. Adrian Blackwater (OB on call) regarding not performing transvaginal US on admission.  -Repeat serum bHCG in 48 hrs -If elevated, consider transvaginal ultrasound  Hypokalemia. 2.9 on admission. Given 10 mEq IV in ED.  -trend K -replete with Kdur x 2 + IV K x 3  Anemia. Mild at 11.9 on admission. Baseline around 12. Given no menses x 4 years per her report, will collect FOBT.  -cont to trend CBC -FOBT -consider iron panel with next labs  FEN/GI: Regular Diet Prophylaxis: LMWH  Disposition: inpatient management of abdominal pain  History of Present Illness:  [name] is a 36 y.o. female presenting with 2 day h/o worsening abdominal pain associated with fever. Her back pain did radiate to her back. She also endorses an associated HA and nausea. She vomited 3 times starting 8/13, non-bloody, non-bilious. Her pain is now improved. She has had minimal PO over the past few days due to nausea. Also endorses dysuria. Denies diarrhea, constipation, HA, increased urinary frequency, SOB when febrile, vaginal discharge/bleeding. She is s/p a tubal ligation. Last had sexual intercourse was intercourse a week ago. Her last period was 4 years ago. She has only taken tylenol at  home, which helped with the HA.   In ED, presentation c/w sepsis. Given location of pain, CT abd performed with concerns for pyelo. 1L NS and zosyn started. Pain  control with 4 mg morphine.   Review Of Systems: Per HPI with the following additions:   Review of Systems  Constitutional: Positive for chills and fever.  Gastrointestinal: Positive for abdominal pain, nausea and vomiting. Negative for constipation and diarrhea.  Genitourinary: Positive for dysuria and flank pain. Negative for frequency and urgency.    Patient Active Problem List   Diagnosis Date Noted  . Constipation 10/25/2014  . Hemorrhoids 10/25/2014  . Allergic conjunctivitis and rhinitis 09/05/2014  . 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 Family planning, BCP (birth control pills) maintenance 06/06/2012  . Birth control counseling 06/06/2012  . Pyelonephritis 10/28/2011  . Pap smear for cervical cancer screening 09/02/2011  . Gastritis 08/07/2011  . Gastric ulcer 06/25/2011  . Reflux 04/15/2011  . Lumbago 03/20/2010    Past Medical History: Past Medical History:  Diagnosis Date  . GERD (gastroesophageal reflux disease)   . Hemorrhoids   . Pap smear for cervical cancer screening 09/02/2011    Past Surgical History: Past Surgical History:  Procedure Laterality Date  . BILATERAL SALPINGECTOMY Bilateral 02/13/2013   Procedure: BILATERAL SALPINGECTOMY;  Surgeon: Purcell Nails, MD;  Location: [facility];  Service: Gynecology;  Laterality: Bilateral;  . CESAREAN SECTION    . ESOPHAGOGASTRODUODENOSCOPY  09/29/2011   Procedure: ESOPHAGOGASTRODUODENOSCOPY (EGD);  Surgeon: Petra Kuba, MD;  Location: [facility];  Service: Endoscopy;  Laterality: N/A;  . LAPAROSCOPIC BILATERAL SALPINGECTOMY Bilateral 02/13/2013   Procedure: LAPAROSCOPIC BILATERAL SALPINGECTOMY VIA FULGERATION;  Surgeon: Purcell Nails, MD;  Location: [facility];  Service: Gynecology;  Laterality: Bilateral;  Laparoscopic BTL via Fulguration/ possible Bilateral Salpingectomy    Social History: Social History  Substance Use Topics  . Smoking status: Never Smoker  . Smokeless tobacco: Never Used  . Alcohol  use No   Additional social history: Lives with husband, 2 daughters. Never smoker, never EtOH, never drugs. Please also refer to relevant sections of EMR.  Family History: No family history on file.   Allergies and Medications: No Known Allergies No current facility-administered medications on file prior to encounter.    No current outpatient prescriptions on file prior to encounter.    Objective: BP (!) 88/60   Pulse (!) 109   Temp 99.2 F (37.3 C) (Oral)   Resp 20   Ht 5\' 2"  (1.575 m)   Wt 125 lb (56.7 kg)   LMP 10/15/2016   SpO2 94%   BMI 22.86 kg/m  Exam: General: well nourished, NAD Eyes: PERRLA ENTM: moist mucus membranes,  Neck: no cervical lymphadenopathy Cardiovascular: rrr, no mrg Respiratory: CTAB, no increased work of breathing, no CVA tenderness Gastrointestinal: normal bowel sounds, soft, nontender, nondistended Patient declined pelvic exam. MSK: 2+ radial and dp pulses bilaterally Derm: warm, intact Neuro: spontaneous movement of all extremities Psych: appropriate affect  Labs and Imaging: CBC BMET   Recent Labs Lab 12/15/16 1942  WBC 14.8*  HGB 11.9*  HCT 36.6  PLT 157    Recent Labs Lab 12/15/16 1942  NA 133*  K 2.9*  CL 100*  CO2 21*  BUN 10  CREATININE 0.77  GLUCOSE 99  CALCIUM 8.6Garnette Gunner, MD 12/16/2016, 3:28 AM PGY-1, [facility] FPTS Intern pager: [phone], text pages welcome  FPTS Upper-Level Resident Addendum  I  have independently interviewed and examined the patient. I have discussed the above with the original author and agree with their documentation. My edits for correction/addition/clarification are in blue. Please see also any attending notes.   Loni MuseKate Yacqub Baston, MD PGY-2, [facility] FPTS Service pager: [phone] (text pages welcome through [facility])

## 2016-12-17 DIAGNOSIS — R7881 Bacteremia: Secondary | ICD-10-CM

## 2016-12-17 DIAGNOSIS — R109 Unspecified abdominal pain: Secondary | ICD-10-CM

## 2016-12-17 LAB — BASIC METABOLIC PANEL
Anion gap: 10 (ref 5–15)
BUN: 5 mg/dL — AB (ref 6–20)
CALCIUM: 8 mg/dL — AB (ref 8.9–10.3)
CO2: 18 mmol/L — ABNORMAL LOW (ref 22–32)
CREATININE: 0.92 mg/dL (ref 0.44–1.00)
Chloride: 107 mmol/L (ref 101–111)
GFR calc Af Amer: >60 mL/min (ref >60)
Glucose, Bld: 236 mg/dL — ABNORMAL HIGH (ref 65–99)
Potassium: 3 mmol/L — ABNORMAL LOW (ref 3.5–5.1)
SODIUM: 135 mmol/L (ref 135–145)

## 2016-12-17 LAB — RETICULOCYTES
RBC.: 4.99 MIL/uL (ref 3.87–5.11)
RETIC CT PCT: 0.8 % (ref 0.4–3.1)
Retic Count, Absolute: 39.9 10*3/uL (ref 19.0–186.0)

## 2016-12-17 LAB — IRON AND TIBC
Iron: 10 ug/dL — ABNORMAL LOW (ref 28–170)
SATURATION RATIOS: 4 % — AB (ref 10.4–31.8)
TIBC: 242 ug/dL — AB (ref 250–450)
UIBC: 232 ug/dL

## 2016-12-17 LAB — CBC WITH DIFFERENTIAL/PLATELET
Basophils Absolute: 0 10*3/uL (ref 0.0–0.1)
Basophils Relative: 0 %
EOS ABS: 0.1 10*3/uL (ref 0.0–0.7)
EOS PCT: 1 %
HCT: 36 % (ref 36.0–46.0)
Hemoglobin: 11.6 g/dL — ABNORMAL LOW (ref 12.0–15.0)
LYMPHS ABS: 1.4 10*3/uL (ref 0.7–4.0)
Lymphocytes Relative: 13 %
MCH: 24.1 pg — AB (ref 26.0–34.0)
MCHC: 32.2 g/dL (ref 30.0–36.0)
MCV: 74.8 fL — ABNORMAL LOW (ref 78.0–100.0)
MONOS PCT: 8 %
Monocytes Absolute: 0.9 10*3/uL (ref 0.1–1.0)
Neutro Abs: 8.5 10*3/uL — ABNORMAL HIGH (ref 1.7–7.7)
Neutrophils Relative %: 78 %
PLATELETS: 100 10*3/uL — AB (ref 150–400)
RBC: 4.81 MIL/uL (ref 3.87–5.11)
RDW: 15.3 % (ref 11.5–15.5)
WBC: 10.9 10*3/uL — AB (ref 4.0–10.5)

## 2016-12-17 LAB — FOLATE: Folate: 13.6 ng/mL (ref >5.9)

## 2016-12-17 LAB — TROPONIN I: TROPONIN I: 0.03 ng/mL — AB (ref <0.03)

## 2016-12-17 LAB — VITAMIN B12: Vitamin B-12: 574 pg/mL (ref 180–914)

## 2016-12-17 LAB — FERRITIN: FERRITIN: 323 ng/mL — AB (ref 11–307)

## 2016-12-17 MED ORDER — SODIUM CHLORIDE 0.9 % IV BOLUS (SEPSIS)
500.0000 mL | Freq: Once | INTRAVENOUS | Status: AC
Start: 2016-12-17 — End: 2016-12-17
  Administered 2016-12-17: 500 mL via INTRAVENOUS

## 2016-12-17 MED ORDER — POTASSIUM CHLORIDE 10 MEQ/100ML IV SOLN
10.0000 meq | INTRAVENOUS | Status: AC
  Filled 2016-12-17: qty 100

## 2016-12-17 MED ORDER — MAGNESIUM SULFATE 2 GM/50ML IV SOLN
2.0000 g | Freq: Once | INTRAVENOUS | Status: DC
  Filled 2016-12-17: qty 50

## 2016-12-17 MED ORDER — POTASSIUM CHLORIDE 10 MEQ/100ML IV SOLN
10.0000 meq | INTRAVENOUS | Status: AC
Start: 2016-12-17 — End: 2016-12-18
  Administered 2016-12-17 – 2016-12-18 (×6): 10 meq via INTRAVENOUS
  Filled 2016-12-17 (×6): qty 100

## 2016-12-17 MED ORDER — POTASSIUM CHLORIDE CRYS ER 20 MEQ PO TBCR
40.0000 meq | EXTENDED_RELEASE_TABLET | Freq: Two times a day (BID) | ORAL | Status: AC
  Administered 2016-12-17: 40 meq via ORAL
  Filled 2016-12-17: qty 2

## 2016-12-17 MED ORDER — MAGNESIUM GLUCONATE 500 MG PO TABS
500.0000 mg | ORAL_TABLET | Freq: Once | ORAL | Status: DC
  Filled 2016-12-17: qty 1

## 2016-12-17 MED ORDER — PANTOPRAZOLE SODIUM 40 MG PO TBEC
40.0000 mg | DELAYED_RELEASE_TABLET | Freq: Every day | ORAL | Status: DC
  Administered 2016-12-17 – 2016-12-20 (×4): 40 mg via ORAL
  Filled 2016-12-17 (×5): qty 1

## 2016-12-17 NOTE — Progress Notes (Signed)
Contacted MD for CBC to be drawn. Potassium given yesterday after lab results of 3.3. Magnesium ordered. No CBC ordered prior to replacement.

## 2016-12-17 NOTE — Progress Notes (Signed)
Family Medicine Teaching Service Daily Progress Note Intern Pager: [phone]  Patient name: [name] Medical record number: [id number] Date of birth: 1981/02/13 Age: 36 y.o. Gender: female  Primary Care Provider: Renne Musca, MD Consultants: None Code Status: Full  Pt Overview and Major Events to Date:  [name] is a 36 y.o. female presenting with 2 day h/o worsening epigastric abdominal pain with emesis and fever. PMH is significant for Gastric Ulcer, Lumbago, Pars defect of lumbar spine. O/n, pt had fever of 101.4. Repeat temperature was 99.0.   Assessment and Plan: [name] is a 36 y.o. female presenting with 2 day h/o worsening epigastric abdominal pain with emesis and fever. PMH is significant for Gastric Ulcer, Lumbago, Pars defect of lumbar spine.   Abdominal pain: improved. Abdomina pain less. WBC down trending 10.9. Pt presented to the ED for abdominal pain radiating to back, right shoulder. She was afebrile, hypotensive. Also febrile to 102.4 and tachycardic 110s, BP 80s/60s. Code Sepsis was initiated and patient was started on Zosyn, given 1L NS bolus. Labs significant for leukocytosis of 14.8, lactic acid 0.93, hyponatremia 133, hypokalemia 2.9. UA showed many bacteria, 80 ketones, moderate leukocytes. bHCG quant 5. Abdominal CT showed Left perinephric edema with heterogenous enhancement of left kidney and multifocal cortical hypodensity, collective findings are concerning for an acute pyelonephritis. Pt 4 mg of morphine in ED with excellent pain control. Most likely pyelo, differential also includes ACS, PE, perforated viscus (ruled out via CT abd/pelvis), ectopic pregnancy, GERD. Will treat pyelo with zosyn given severity of presentation, narrow as urine culture allows. Wells score PE only 1.5 due to tachycardia, but cannot rule out with PERC criteria. Will order stat D dimer. Low likelihood ACS, but as women can present atypically, will get EKG and troponins. EKG with  new QT prolongation. Phos wnl. Mg low normal 1.5. Likely urosepis 2/2 pylonephritis -Troponin elevated: 0.04 > 0.07 > 0.04 > 0.03, likely elevated due to sepsis -Blood cultures, GNR, Enterobacteriaceae, E. Coli., suseptibilities -Urine Culture, >=100,000 COLONIES/mL GRAM NEGATIVE RODS  -Zosyn d/c'd, narrowed to ceftriaxone IV -trend Troponins -Zofran PRN -Tylenol and Ibuprofen prn -mIVF NS @ 100 mL/hr -Cardiac monitoring -follow bp -trend WBC -replete Mg w/ IV Mg -repeat ekg due to t wave abnormalities, if persist, consult cardiology  Elevated bHCG. Improved. Repeat bHCG was 2. Mildly elevated on admission at 5. Pt is s/p tubal ligation. Last sexual intercouse 1 week ago. Pt reports not having periods for past 4 years. Uterus was observed on CT abdomen. Pt not likely pregnant given low levels, but bHCG elevation concerning for possible ectopic pregnancy or underlying gestational trophoblastic disease. E coli sepsis can also cause false positive Beta Hcg. Will closely monitor abdominal exams. TVUS revealed No acute findings. Revealed small volume of free fluid in the pelvis.  Nonspecific.  - continue to monitor abdominal exam  Hypokalemia. Improved. Last K+ 3.3. 2.9 on admission. Given 10 mEq IV in ED.  -trend K -replete as needed  Anemia. Stable Mild at 11.0 on admission. Baseline around 12. Given no menses x 4 years per her report, will collect FOBT. Anemia Panel: Iron 10, TIBC 242, Saturation 4, Ferritin 323. Mixed anemia picture consistent with iron deficiency and possible inflammation/anemia chronic disease/Folate/B12 wnl. H/o gastric ulcers.  -cont to trend CBC -add CBC w/ diff -FOBT, positive -Start PPI -f/u on h/o hematochezia/diarrhea -Consider out pt GI for further evaluation and management  Tachycardia. Improved. No has nl rate.  Pt presented with persistent tachycardia. Pt  was hypotensive as well. Tachycardia did not improve despite multiple fluid boluses. No s/s  respiratory respiratory distress.  Pt had elevated D-dimer, 1.86, CTA Chest no evidence of PE. Likely due to underlying pyelonephritis.  -Continue to monitor heart rate and respiratory status  FEN/GI: Full Prophylaxis: LMWH  Disposition: inpatient management of GNR sepsis Subjective:  Pt says she feels chills. She says her abominal pain is improved but that she is also hungry. Denies cp, sob.   Objective: Temp:  [98.9 F (37.2 C)-102.2 F (39 C)] 99.7 F (37.6 C) (08/16 0651) Pulse Rate:  [71-97] 86 (08/16 0651) Resp:  [16-20] 20 (08/16 0651) BP: (87-105)/(58-72) 105/72 (08/16 0651) SpO2:  [95 %-100 %] 100 % (08/16 0651) Physical Exam: General: mild diaphroetic, forehead warm to touch Cardiovascular: rrr, no mrg Respiratory: CTAB, no increased work of breathing Abdomen: mildy tender LLQ, no rebound/guarding Extremities: no lower extremity edema, 2+ dp  Laboratory:  Recent Labs Lab 12/15/16 1942 12/16/16 0411  WBC 14.8* 12.4*  HGB 11.9* 11.0*  HCT 36.6 34.2*  PLT 157 133*    Recent Labs Lab 12/15/16 1942 12/16/16 0411 12/16/16 0830 12/16/16 1656  NA 133* 138 138 140  K 2.9* 2.7* 3.3* 3.3*  CL 100* 107 108 112*  CO2 21* 22 20* 20*  BUN 10 6 6  <5*  CREATININE 0.77 0.75 0.76 0.75  CALCIUM 8.6* 7.9* 7.4* 7.9*  PROT 7.2  --   --   --   BILITOT 1.1  --   --   --   ALKPHOS 66  --   --   --   ALT 14  --   --   --   AST 22  --   --   --   GLUCOSE 99 108* 101* 109*      Imaging/Diagnostic Tests: Ct Angio Chest Pe W Or Wo Contrast  Result Date: 12/16/2016 CLINICAL DATA:  Elevated D-dimer level. EXAM: CT ANGIOGRAPHY CHEST WITH CONTRAST TECHNIQUE: Multidetector CT imaging of the chest was performed using the standard protocol during bolus administration of intravenous contrast. Multiplanar CT image reconstructions and MIPs were obtained to evaluate the vascular anatomy. CONTRAST:  70 mL of Isovue 370 intravenously. COMPARISON:  Radiograph of August 03, 2011.  FINDINGS: Cardiovascular: Satisfactory opacification of the pulmonary arteries to the segmental level. No evidence of pulmonary embolism. Normal heart size. No pericardial effusion. Mediastinum/Nodes: No enlarged mediastinal, hilar, or axillary lymph nodes. Thyroid gland, trachea, and esophagus demonstrate no significant findings. Lungs/Pleura: Lungs are clear. No pleural effusion or pneumothorax. Upper Abdomen: No acute abnormality. Musculoskeletal: No chest wall abnormality. No acute or significant osseous findings. Review of the MIP images confirms the above findings. IMPRESSION: No definite evidence of pulmonary embolus. No definite abnormality seen in the chest. Electronically Signed   By: Lupita Raider, M.D.   On: 12/16/2016 07:36   US Transvaginal Non-ob  Result Date: 12/16/2016 CLINICAL DATA:  History of bilateral salpingectomy.  Abdominal pain. EXAM: TRANSABDOMINAL AND TRANSVAGINAL ULTRASOUND OF PELVIS TECHNIQUE: Both transabdominal and transvaginal ultrasound examinations of the pelvis were performed. Transabdominal technique was performed for global imaging of the pelvis including uterus, ovaries, adnexal regions, and pelvic cul-de-sac. It was necessary to proceed with endovaginal exam following the transabdominal exam to visualize the ovaries. COMPARISON:  CT from 12/16/2016 FINDINGS: Uterus Measurements: 5.6 x 3.5 x 3.6 cm.  Retroflexed. Endometrium Thickness: 3.6 mm.  No focal abnormality visualized. Right ovary Measurements: Not visualize. No adnexal mass noted. Left ovary Measurements: 2.7 x 1.6  x 1.6 cm. Normal appearance/no adnexal mass. Other findings Small volume of free fluid noted within the pelvis. IMPRESSION: 1. No acute findings identified. 2. Small volume of free fluid in the pelvis.  Nonspecific. Electronically Signed   By: Signa Kellaylor  Stroud M.D.   On: 12/16/2016 20:50   [place] Pelvis Complete  Result Date: 12/16/2016 CLINICAL DATA:  History of bilateral salpingectomy.  Abdominal pain.  EXAM: TRANSABDOMINAL AND TRANSVAGINAL ULTRASOUND OF PELVIS TECHNIQUE: Both transabdominal and transvaginal ultrasound examinations of the pelvis were performed. Transabdominal technique was performed for global imaging of the pelvis including uterus, ovaries, adnexal regions, and pelvic cul-de-sac. It was necessary to proceed with endovaginal exam following the transabdominal exam to visualize the ovaries. COMPARISON:  CT from 12/16/2016 FINDINGS: Uterus Measurements: 5.6 x 3.5 x 3.6 cm.  Retroflexed. Endometrium Thickness: 3.6 mm.  No focal abnormality visualized. Right ovary Measurements: Not visualize. No adnexal mass noted. Left ovary Measurements: 2.7 x 1.6 x 1.6 cm. Normal appearance/no adnexal mass. Other findings Small volume of free fluid noted within the pelvis. IMPRESSION: 1. No acute findings identified. 2. Small volume of free fluid in the pelvis.  Nonspecific. Electronically Signed   By: Signa Kellaylor  Stroud M.D.   On: 12/16/2016 20:50   Ct Abdomen Pelvis W Contrast  Result Date: 12/16/2016 CLINICAL DATA:  Abdominal pain nausea and vomiting EXAM: CT ABDOMEN AND PELVIS WITH CONTRAST TECHNIQUE: Multidetector CT imaging of the abdomen and pelvis was performed using the standard protocol following bolus administration of intravenous contrast. CONTRAST:  100mL ISOVUE-300 IOPAMIDOL (ISOVUE-300) INJECTION 61% COMPARISON:  03/12/2008 FINDINGS: Lower chest: Lung bases demonstrate no acute consolidation or effusion. Normal heart size. Hepatobiliary: No focal liver abnormality is seen. No gallstones, gallbladder wall thickening, or biliary dilatation. Pancreas: Unremarkable. No pancreatic ductal dilatation or surrounding inflammatory changes. Spleen: Normal in size without focal abnormality. Adrenals/Urinary Tract: Adrenal glands are within normal limits. Left perinephric fat stranding. Heterogenous hypodensities within the left renal cortex. Mild urothelial enhancement on the left with slight prominence of left  ureter. Bladder unremarkable. Stomach/Bowel: Stomach is within normal limits. Appendix appears normal. No evidence of bowel wall thickening, distention, or inflammatory changes. Vascular/Lymphatic: No significant vascular findings are present. No enlarged abdominal or pelvic lymph nodes. Reproductive: Uterus and bilateral adnexa are unremarkable. Other:  Negative for free air or free fluid. Musculoskeletal: Stable 7 mm anterolisthesis of L4 on L5. 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 IMPRESSION: 1. Left perinephric edema with heterogenous enhancement of left kidney and multifocal cortical hypodensity, collective findings are concerning for an acute pyelonephritis. Slight prominence of left ureter with urothelial enhancement, no definite stones seen along course of left ureter. 2. Negative appendix 3. Stable anterolisthesis of L4 on L5 with chronic appearing pars defects at L4 and L5 Electronically Signed   By: Jasmine PangKim  Fujinaga M.D.   On: 12/16/2016 02:18    Garnette Gunnerhompson, Fidencia Mccloud B, MD 12/17/2016, 6:53 AM PGY-1, [place] [facility] [facility] FPTS Intern pager: [phone], text pages welcome

## 2016-12-17 NOTE — Progress Notes (Signed)
FPTS Interim Progress Note  S:Paged by nursing to notify team of a low blood pressure in [name].  I confirmed that nursing thought she looked well and that her vitals were otherwise quite stable.   Ordered a 500ml NS bolus and made plans to see her in the room as soon as possible.  O: BP 106/74 (BP Location: Left Arm)   Pulse 71   Temp 99 F (37.2 C) (Oral)   Resp 17   Ht 5\' 2"  (1.575 m)   Wt 125 lb (56.7 kg)   LMP 10/15/2016   SpO2 96%   BMI 22.86 kg/m   Went to evaluate [name] in the room.   She was walking around the room talking with visitors.   I asked if she needed an interpretor and she waved off the offer and said no.  She told me she was feeling ok now but earlier in the day had felt hot/cold but that was over.   She said she had no pain and her only complaint during the evaluation came when the potassium pump began because it burned her arm.  A/P: Assymptomatic Hypotension likely secondary to the patient's septic response: Finish 500ml bolus and then get new vitals and reasses patient  New onset pain at IV site Likely due to potassium replenished by IV, nursing will adjust the rate to patient's comfort  [name], Olivia Orner, DO 12/17/2016, 4:03 PM PGY-1, [facility] Service pager [phone]

## 2016-12-17 NOTE — Progress Notes (Signed)
Provider is ordering labs before we replace magnesium and potassium.

## 2016-12-18 DIAGNOSIS — R748 Abnormal levels of other serum enzymes: Secondary | ICD-10-CM

## 2016-12-18 DIAGNOSIS — A4151 Sepsis due to Escherichia coli [E. coli]: Principal | ICD-10-CM

## 2016-12-18 DIAGNOSIS — R9431 Abnormal electrocardiogram [ECG] [EKG]: Secondary | ICD-10-CM

## 2016-12-18 DIAGNOSIS — R109 Unspecified abdominal pain: Secondary | ICD-10-CM

## 2016-12-18 LAB — CBC
HCT: 33.3 % — ABNORMAL LOW (ref 36.0–46.0)
HEMOGLOBIN: 10.7 g/dL — AB (ref 12.0–15.0)
MCH: 23.3 pg — AB (ref 26.0–34.0)
MCHC: 32.1 g/dL (ref 30.0–36.0)
MCV: 72.4 fL — AB (ref 78.0–100.0)
Platelets: 151 10*3/uL (ref 150–400)
RBC: 4.6 MIL/uL (ref 3.87–5.11)
RDW: 15 % (ref 11.5–15.5)
WBC: 8 10*3/uL (ref 4.0–10.5)

## 2016-12-18 LAB — CULTURE, BLOOD (ROUTINE X 2)
Special Requests: ADEQUATE
Special Requests: ADEQUATE

## 2016-12-18 LAB — BASIC METABOLIC PANEL
Anion gap: 10 (ref 5–15)
CHLORIDE: 107 mmol/L (ref 101–111)
CO2: 20 mmol/L — AB (ref 22–32)
CREATININE: 0.74 mg/dL (ref 0.44–1.00)
Calcium: 8.5 mg/dL — ABNORMAL LOW (ref 8.9–10.3)
GFR calc Af Amer: >60 mL/min (ref >60)
GFR calc non Af Amer: >60 mL/min (ref >60)
GLUCOSE: 98 mg/dL (ref 65–99)
POTASSIUM: 3.7 mmol/L (ref 3.5–5.1)
Sodium: 137 mmol/L (ref 135–145)

## 2016-12-18 LAB — URINE CULTURE: Culture: >=100000 — AB

## 2016-12-18 NOTE — Consult Note (Signed)
Cardiology Consultation:   Patient ID: Duane Deboise; [id number]; 08/13/1980   Admit date: 12/15/2016 Date of Consult: 12/18/2016  Primary Care Provider: Renne Musca, MD Primary Cardiologist: New   Patient Profile:   [name] is a 36 y.o[place] with no prior cardiac history who is being seen today for the evaluation of abnormal EKG and elevated Troponin at the request of Dr Gwendolyn Grant.  History of Present Illness:   [name] is a 36 y/o [place] female with no prior cardiac issues per her records. She has a PMH of GERD and was placed on a PPI in April this year. She was admitted 12/15/16 with nausea, vomiting, epigastric pain with radiation to her Lt shoulder, and fever of 102. Work indicated urosepsis. She had an elevated D-dimer of 1.86 and a Troponin of 0.05-0.07-0.04-0.03. There was concern for a PE but CTA was negative for PE. (no coronary calcification mentioned). Her admission EKG showed NSR, sinus tachycardia. On 8/15 her EKG showed new TWI in V2 and V3 and this is more pronounced on today's EKG. Although, review of prior EKG's demonstrates these findings were noted in 2013. While this could be consistent with ischemia, hypertrophic cardiomyopathy is also a possibility.  Past Medical History:  Diagnosis Date  . GERD (gastroesophageal reflux disease)   . Hemorrhoids   . Pap smear for cervical cancer screening 09/02/2011    Past Surgical History:  Procedure Laterality Date  . BILATERAL SALPINGECTOMY Bilateral 02/13/2013   Procedure: BILATERAL SALPINGECTOMY;  Surgeon: Purcell Nails, MD;  Location: [facility];  Service: Gynecology;  Laterality: Bilateral;  . CESAREAN SECTION    . ESOPHAGOGASTRODUODENOSCOPY  09/29/2011   Procedure: ESOPHAGOGASTRODUODENOSCOPY (EGD);  Surgeon: Petra Kuba, MD;  Location: [facility];  Service: Endoscopy;  Laterality: N/A;  . LAPAROSCOPIC BILATERAL SALPINGECTOMY Bilateral 02/13/2013   Procedure: LAPAROSCOPIC BILATERAL SALPINGECTOMY  VIA FULGERATION;  Surgeon: Purcell Nails, MD;  Location: [facility];  Service: Gynecology;  Laterality: Bilateral;  Laparoscopic BTL via Fulguration/ possible Bilateral Salpingectomy      Inpatient Medications: Scheduled Meds: . magnesium gluconate  500 mg Oral Once  . ondansetron (ZOFRAN) IV  4 mg Intravenous Q8H  . pantoprazole  40 mg Oral Daily  . sodium chloride flush  3 mL Intravenous Q12H   Continuous Infusions: . sodium chloride 100 mL/hr at 12/18/16 1023  . cefTRIAXone (ROCEPHIN)  IV Stopped (12/17/16 1910)   PRN Meds: acetaminophen, ibuprofen  Allergies:   No Known Allergies  Social History:   Social History   Social History  . Marital status: Married    Spouse name: N/A  . Number of children: N/A  . Years of education: N/A   Occupational History  . Not on file.   Social History Main Topics  . Smoking status: Never Smoker  . Smokeless tobacco: Never Used  . Alcohol use No  . Drug use: No  . Sexual activity: Not on file   Other Topics Concern  . Not on file   Social History Narrative  . No narrative on file    Family History:   FM Hx- No cardiac problems in her family that she is aware of.  ROS:  Please see the history of present illness.  ROS  All other ROS reviewed and negative.     Physical Exam/Data:   Vitals:   12/17/16 1812 12/17/16 2301 12/18/16 0512 12/18/16 1429  BP:  96/69 105/75 99/80  Pulse:  76 80 78  Resp:  20 (!) 26 16  Temp: 99.7 F (37.6 C) 98.6 F (37 C) 99.6 F (37.6 C) 98.6 F (37 C)  TempSrc: Oral Oral  Oral  SpO2:  96% 96% 100%  Weight:      Height:        Intake/Output Summary (Last 24 hours) at 12/18/16 1532 Last data filed at 12/18/16 1610  Gross per 24 hour  Intake             2980 ml  Output              800 ml  Net             2180 ml   Filed Weights   12/15/16 1933  Weight: 125 lb (56.7 kg)   Body mass index is 22.86 kg/m.  General appearance: alert and no distress Neck: no carotid bruit, no JVD  and thyroid not enlarged, symmetric, no tenderness/mass/nodules Lungs: clear to auscultation bilaterally Heart: regular rate and rhythm, S1, S2 normal, no murmur, click, rub or gallop Abdomen: soft, non-tender; bowel sounds normal; no masses,  no organomegaly Extremities: extremities normal, atraumatic, no cyanosis or edema Pulses: 2+ and symmetric Skin: Skin color, texture, turgor normal. No rashes or lesions Neurologic: Grossly normal Psych: Pleasant   EKG:  The EKG was personally reviewed and demonstrates:  NSR at 72, T wave inversion in V1-V3 Telemetry:  Telemetry was personally reviewed and demonstrates:  Normal sinus rhythm  Laboratory Data:  Chemistry Recent Labs Lab 12/16/16 1656 12/17/16 1040 12/18/16 0607  NA 140 135 137  K 3.3* 3.0* 3.7  CL 112* 107 107  CO2 20* 18* 20*  GLUCOSE 109* 236* 98  BUN <5* 5* <5*  CREATININE 0.75 0.92 0.74  CALCIUM 7.9* 8.0* 8.5*  GFRNONAA >60 >60 >60  GFRAA >60 >60 >60  ANIONGAP 8 10 10      Recent Labs Lab 12/15/16 1942  PROT 7.2  ALBUMIN 3.5  AST 22  ALT 14  ALKPHOS 66  BILITOT 1.1   Hematology Recent Labs Lab 12/16/16 0411 12/17/16 0128 12/18/16 0607  WBC 12.4* 10.9* 8.0  RBC 4.64 4.81  4.99 4.60  HGB 11.0* 11.6* 10.7*  HCT 34.2* 36.0 33.3*  MCV 73.7* 74.8* 72.4*  MCH 23.7* 24.1* 23.3*  MCHC 32.2 32.2 32.1  RDW 14.5 15.3 15.0  PLT 133* 100* 151   Cardiac Enzymes Recent Labs Lab 12/16/16 0411 12/16/16 0830 12/16/16 1514 12/16/16 2117 12/17/16 0128  TROPONINI 0.05* 0.04* 0.07* 0.04* 0.03*   No results for input(s): TROPIPOC in the last 168 hours.  BNPNo results for input(s): BNP, PROBNP in the last 168 hours.  DDimer  Recent Labs Lab 12/16/16 0411  DDIMER 1.86*    Radiology/Studies:  Ct Angio Chest Pe W Or Wo Contrast  Result Date: 12/16/2016 CLINICAL DATA:  Elevated D-dimer level. EXAM: CT ANGIOGRAPHY CHEST WITH CONTRAST TECHNIQUE: Multidetector CT imaging of the chest was performed using the  standard protocol during bolus administration of intravenous contrast. Multiplanar CT image reconstructions and MIPs were obtained to evaluate the vascular anatomy. CONTRAST:  70 mL of Isovue 370 intravenously. COMPARISON:  Radiograph of August 03, 2011. FINDINGS: Cardiovascular: Satisfactory opacification of the pulmonary arteries to the segmental level. No evidence of pulmonary embolism. Normal heart size. No pericardial effusion. Mediastinum/Nodes: No enlarged mediastinal, hilar, or axillary lymph nodes. Thyroid gland, trachea, and esophagus demonstrate no significant findings. Lungs/Pleura: Lungs are clear. No pleural effusion or pneumothorax. Upper Abdomen: No acute abnormality. Musculoskeletal: No chest wall abnormality. No acute or significant osseous  findings. Review of the MIP images confirms the above findings. IMPRESSION: No definite evidence of pulmonary embolus. No definite abnormality seen in the chest. Electronically Signed   By: Lupita Raider, M.D.   On: 12/16/2016 07:36   US Transvaginal Non-ob  Result Date: 12/16/2016 CLINICAL DATA:  History of bilateral salpingectomy.  Abdominal pain. EXAM: TRANSABDOMINAL AND TRANSVAGINAL ULTRASOUND OF PELVIS TECHNIQUE: Both transabdominal and transvaginal ultrasound examinations of the pelvis were performed. Transabdominal technique was performed for global imaging of the pelvis including uterus, ovaries, adnexal regions, and pelvic cul-de-sac. It was necessary to proceed with endovaginal exam following the transabdominal exam to visualize the ovaries. COMPARISON:  CT from 12/16/2016 FINDINGS: Uterus Measurements: 5.6 x 3.5 x 3.6 cm.  Retroflexed. Endometrium Thickness: 3.6 mm.  No focal abnormality visualized. Right ovary Measurements: Not visualize. No adnexal mass noted. Left ovary Measurements: 2.7 x 1.6 x 1.6 cm. Normal appearance/no adnexal mass. Other findings Small volume of free fluid noted within the pelvis. IMPRESSION: 1. No acute findings  identified. 2. Small volume of free fluid in the pelvis.  Nonspecific. Electronically Signed   By: Signa Kell M.D.   On: 12/16/2016 20:50   US Pelvis Complete  Result Date: 12/16/2016 CLINICAL DATA:  History of bilateral salpingectomy.  Abdominal pain. EXAM: TRANSABDOMINAL AND TRANSVAGINAL ULTRASOUND OF PELVIS TECHNIQUE: Both transabdominal and transvaginal ultrasound examinations of the pelvis were performed. Transabdominal technique was performed for global imaging of the pelvis including uterus, ovaries, adnexal regions, and pelvic cul-de-sac. It was necessary to proceed with endovaginal exam following the transabdominal exam to visualize the ovaries. COMPARISON:  CT from 12/16/2016 FINDINGS: Uterus Measurements: 5.6 x 3.5 x 3.6 cm.  Retroflexed. Endometrium Thickness: 3.6 mm.  No focal abnormality visualized. Right ovary Measurements: Not visualize. No adnexal mass noted. Left ovary Measurements: 2.7 x 1.6 x 1.6 cm. Normal appearance/no adnexal mass. Other findings Small volume of free fluid noted within the pelvis. IMPRESSION: 1. No acute findings identified. 2. Small volume of free fluid in the pelvis.  Nonspecific. Electronically Signed   By: Signa Kell M.D.   On: 12/16/2016 20:50   Ct Abdomen Pelvis W Contrast  Result Date: 12/16/2016 CLINICAL DATA:  Abdominal pain nausea and vomiting EXAM: CT ABDOMEN AND PELVIS WITH CONTRAST TECHNIQUE: Multidetector CT imaging of the abdomen and pelvis was performed using the standard protocol following bolus administration of intravenous contrast. CONTRAST:  ISOVUE-300 IOPAMIDOL (ISOVUE-300) INJECTION 61% COMPARISON:  03/12/2008 FINDINGS: Lower chest: Lung bases demonstrate no acute consolidation or effusion. Normal heart size. Hepatobiliary: No focal liver abnormality is seen. No gallstones, gallbladder wall thickening, or biliary dilatation. Pancreas: Unremarkable. No pancreatic ductal dilatation or surrounding inflammatory changes. Spleen: Normal  in size without focal abnormality. Adrenals/Urinary Tract: Adrenal glands are within normal limits. Left perinephric fat stranding. Heterogenous hypodensities within the left renal cortex. Mild urothelial enhancement on the left with slight prominence of left ureter. Bladder unremarkable. Stomach/Bowel: Stomach is within normal limits. Appendix appears normal. No evidence of bowel wall thickening, distention, or inflammatory changes. Vascular/Lymphatic: No significant vascular findings are present. No enlarged abdominal or pelvic lymph nodes. Reproductive: Uterus and bilateral adnexa are unremarkable. Other:  Negative for free air or free fluid. Musculoskeletal: Stable 7 mm anterolisthesis of L4 on L5. 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 IMPRESSION: 1. Left perinephric edema with heterogenous enhancement of left kidney and multifocal cortical hypodensity, collective findings are concerning for an acute pyelonephritis. Slight prominence of left ureter with urothelial enhancement, no definite stones  seen along course of left ureter. 2. Negative appendix 3. Stable anterolisthesis of L4 on L5 with chronic appearing pars defects at L4 and L5 Electronically Signed   By: Jasmine Pang M.D.   On: 12/16/2016 02:18    Assessment and Plan:   Abnormal EKG- Septal TWI - these were seen in 2013, so I suspect represent repolarization abnormality - there could be possible underlying HCM. Ischemia is also possible, but less likely. The troponins are flat and only mildly elevated - more consistent with sepsis picture. Will check an echocardiogram. Based on those findings, she may need an additional ischemia evaluation.  Pyelonephritis/ sepsis  BC was positive for E. Coli and enterobacter. Treatment per primary service  Elevated bHCG  Work up per primary service.  Plan: check echo. MD to see  Signed, Corine Shelter, PA-C  12/18/2016 3:32 PM   Pt. Seen and examined. Agree with the Resident/NP/PA-C note as  written. [name] is a 36 y.o. female who is being seen today for the evaluation of elevated troponin at the request of Dr. Gwendolyn Grant. She is a [place] female who presented with nausea, vomiting and epigastric/flank pain and found to have pyelonephritis/sepsis. EKG's checked and demonstrated T wave inversions - troponin was noted to be mildly elevated, but has been flat. Findings more consistent with infection. She has had prior anteroseptal T wave inversions dating back to [facility] in 2013. I suspect this is repolarization abnormality or related to structural abnormality, such as HCM - but not as likely to be due to ACS. Agree with the plan for an echocardiogram and possible ischemic workup if that is unrevealing - likely as an outpatient.  Thanks for the consultation. Cardiology will follow-up on the echo over the weekend.  Time Spent with Patient: I have spent a total of 35 minutes with patient reviewing hospital notes, telemetry, EKGs, labs and examining the patient as well as establishing an assessment and plan that was discussed with the patient. > 50% of time was spent in direct patient care.  Chrystie Nose, MD, [facility]  [place]  [facility]  Attending Cardiologist  Direct Dial: [phone]  Fax: [phone]  Website:  [email or web address]

## 2016-12-18 NOTE — Consult Note (Signed)
   [facility] CM Inpatient Consult   12/18/2016  [name] 08-09-1980 [id number]     Came to visit [name] at bedside on behalf of Link to [facility] Care Management program for [facility] employees/dependents with [facility] insurance.  Discussed Link to Wellness program. Denies having any Link to Wellness needs.   Agreeable to post hospital discharge call. Confirmed best contact number as [phone].   Provided FMLA/[employer] number per [name] request.  Provided Link to [employer] and 24-hr nurse line magnet.   Appreciative of visit. Made inpatient RNCM aware.    Raiford Noble, MSN-Ed, RN,BSN [facility] Liaison [phone]

## 2016-12-18 NOTE — Progress Notes (Signed)
Family Medicine Teaching Service Daily Progress Note Intern Pager: [phone]  Patient name: [name] Medical record number: [id number] Date of birth: [birth year] Age: 36 y.o. Gender: female  Primary Care Provider: Renne Musca, MD Consultants: None Code Status: Full  Pt Overview and Major Events to Date:  [name] is a 36 y.o. female presenting with 2 day h/o worsening epigastric abdominal pain with emesis and fever. PMH is significant for Gastric Ulcer, Lumbago, Pars defect of lumbar spine. O/n, pt had fever of 101.4. Repeat temperature was 99.0.   Assessment and Plan: [name] is a 36 y.o. female presenting with 2 day h/o worsening epigastric abdominal pain with emesis and fever. PMH is significant for Gastric Ulcer, Lumbago, Pars defect of lumbar spine.   Abdominal pain: stable. Pt endorses pain in RUQ that radiates to back and shoulder. Afebrile overnight, but still endorsing chills. WBC trending down to 8.0. Pt presented to the ED for abdominal pain radiating to back, right shoulder. She was afebrile, hypotensive. Also febrile to 102.4 and tachycardic 110s, BP 80s/60s. Code Sepsis was initiated and patient was started on Zosyn, given 1L NS bolus. Labs significant for leukocytosis of 14.8, lactic acid 0.93, hyponatremia 133, hypokalemia 2.9. UA showed many bacteria, 80 ketones, moderate leukocytes. bHCG quant 5. Abdominal CT showed Left perinephric edema with heterogenous enhancement of left kidney and multifocal cortical hypodensity, collective findings are concerning for an acute pyelonephritis.  Most likely urosepsis w/ GNR bacteremia 2/2 to pyelo, differential also includes ACS, PE (ruled out by CTA), perforated viscus (ruled out via CT abd/pelvis), ectopic pregnancy (repeat bhg downtrending, negative TVUS), GERD. Less likelihood ACS, but did have EKG w/ Twave changes and elevated troponins.  Phos wnl. Mg low normal 1.5. Likely urosepis 2/2 pylonephritis. Troponin elevated:  0.04 > 0.07 > 0.04 > 0.03, likely elevated due to sepsis -Blood cultures, GNR, E. Coli.,  -Urine Culture, >=100,000 COLONIES/mL GRAM NEGATIVE RODS, urine susceptibility back -Zosyn d/c'd on 08/16, narrowed to ceftriaxone IV on 08/16 -consider transitioning to PO abx, (ampicillin?), if afebrile for 48 hrs -Tylenol and Ibuprofen prn -mIVF NS @ 100 mL/hr -Cardiac monitoring -follow bp -trend WBC -repeat ekg showed persistent t wave abnormalities, consult cardiology  Elevated bHCG. Improved. Repeat bHCG was 2. Mildly elevated on admission at 5. Pt is s/p tubal ligation. Last sexual intercouse 1 week ago. Pt reports not having periods for past 4 years. Uterus was observed on CT abdomen. Pt not likely pregnant given low levels, but bHCG elevation concerning for possible ectopic pregnancy or underlying gestational trophoblastic disease. E coli sepsis can also cause false positive Beta Hcg. Will closely monitor abdominal exams. TVUS revealed No acute findings. Revealed small volume of free fluid in the pelvis.  Nonspecific.  - continue to monitor abdominal exam  Hypokalemia. Improved. Last K+ 3.7. 2.9 on admission.  -trend K -replete as needed  Anemia. Cont to downtrend 10.7. Mild at 11.0 on admission. Baseline around 12. Given no menses x 4 years per her report, will collect FOBT. Anemia Panel: Iron 10, TIBC 242, Saturation 4, Ferritin 323. Mixed anemia picture consistent with iron deficiency and possible inflammation/anemia chronic disease/Folate/B12 wnl. H/o gastric ulcers. Denies h/o diarrhea, dark tary stools, hematochezia.  -cont to trend CBC -add CBC w/ diff -FOBT, positive -d/c ibuprofen for possible worsening of gi bleed -on PPI -Consider out pt GI for further evaluation and management  Tachycardia. Improved. No has nl rate.  Pt presented with persistent tachycardia. Pt was hypotensive as well. Tachycardia did  not improve despite multiple fluid boluses. No s/s respiratory respiratory  distress.  Pt had elevated D-dimer, 1.86, CTA Chest no evidence of PE. Likely due to urosepsis w/ GNR bacteremia.  -Continue to monitor heart rate and respiratory status  Hypotension. Stable. BP in AM 105/75. BP on admit 80s/50s. Has improved s/p fluid bolus yesterday afternoon.  -cont to monitor BP  FEN/GI: Full Prophylaxis: LMWH  Disposition: inpatient management of GNR sepsis, possible discharge tomorrow if clinically improving on PO antibiotics  Subjective:  Pt says she feels chills and still having abominal pain. Abdominal pain is worse with chills. Afebrile overnight. Denies cp, dizziness, sob.   Objective: Temp:  [98.1 F (36.7 C)-99.7 F (37.6 C)] 98.7 F (37.1 C) (08/17 0659) Pulse Rate:  [76-93] 78 (08/17 0659) Resp:  [16-26] 24 (08/17 0659) BP: (84-106)/(55-75) 91/61 (08/17 0659) SpO2:  [94 %-96 %] 94 % (08/17 0659) Physical Exam: General: NAD Cardiovascular: rrr, no mrg Respiratory: CTAB, no increased work of breathing Abdomen: mildy tender RUQ, radiating to back and to shoulder, no rebound/guarding Extremities: no lower extremity edema, 2+ dp  Laboratory:  Recent Labs Lab 12/16/16 0411 12/17/16 0128 12/18/16 0607  WBC 12.4* 10.9* 8.0  HGB 11.0* 11.6* 10.7*  HCT 34.2* 36.0 33.3*  PLT 133* 100* 151    Recent Labs Lab 12/15/16 1942  12/16/16 1656 12/17/16 1040 12/18/16 0607  NA 133*  < > 140 135 137  K 2.9*  < > 3.3* 3.0* 3.7  CL 100*  < > 112* 107 107  CO2 21*  < > 20* 18* 20*  BUN 10  < > <5* 5* <5*  CREATININE 0.77  < > 0.75 0.92 0.74  CALCIUM 8.6*  < > 7.9* 8.0* 8.5*  PROT 7.2  --   --   --   --   BILITOT 1.1  --   --   --   --   ALKPHOS 66  --   --   --   --   ALT 14  --   --   --   --   AST 22  --   --   --   --   GLUCOSE 99  < > 109* 236* 98  < > = values in this interval not displayed.    Imaging/Diagnostic Tests: Ct Angio Chest Pe W Or Wo Contrast  Result Date: 12/16/2016 CLINICAL DATA:  Elevated D-dimer level. EXAM: CT  ANGIOGRAPHY CHEST WITH CONTRAST TECHNIQUE: Multidetector CT imaging of the chest was performed using the standard protocol during bolus administration of intravenous contrast. Multiplanar CT image reconstructions and MIPs were obtained to evaluate the vascular anatomy. CONTRAST:  70 mL of Isovue 370 intravenously. COMPARISON:  Radiograph of August 03, 2011. FINDINGS: Cardiovascular: Satisfactory opacification of the pulmonary arteries to the segmental level. No evidence of pulmonary embolism. Normal heart size. No pericardial effusion. Mediastinum/Nodes: No enlarged mediastinal, hilar, or axillary lymph nodes. Thyroid gland, trachea, and esophagus demonstrate no significant findings. Lungs/Pleura: Lungs are clear. No pleural effusion or pneumothorax. Upper Abdomen: No acute abnormality. Musculoskeletal: No chest wall abnormality. No acute or significant osseous findings. Review of the MIP images confirms the above findings. IMPRESSION: No definite evidence of pulmonary embolus. No definite abnormality seen in the chest. Electronically Signed   By: Lupita Raider, M.D.   On: 12/16/2016 07:36   US Transvaginal Non-ob  Result Date: 12/16/2016 CLINICAL DATA:  History of bilateral salpingectomy.  Abdominal pain. EXAM: TRANSABDOMINAL AND TRANSVAGINAL ULTRASOUND OF PELVIS TECHNIQUE:  Both transabdominal and transvaginal ultrasound examinations of the pelvis were performed. Transabdominal technique was performed for global imaging of the pelvis including uterus, ovaries, adnexal regions, and pelvic cul-de-sac. It was necessary to proceed with endovaginal exam following the transabdominal exam to visualize the ovaries. COMPARISON:  CT from 12/16/2016 FINDINGS: Uterus Measurements: 5.6 x 3.5 x 3.6 cm.  Retroflexed. Endometrium Thickness: 3.6 mm.  No focal abnormality visualized. Right ovary Measurements: Not visualize. No adnexal mass noted. Left ovary Measurements: 2.7 x 1.6 x 1.6 cm. Normal appearance/no adnexal mass.  Other findings Small volume of free fluid noted within the pelvis. IMPRESSION: 1. No acute findings identified. 2. Small volume of free fluid in the pelvis.  Nonspecific. Electronically Signed   By: Signa Kell M.D.   On: 12/16/2016 20:50   US Pelvis Complete  Result Date: 12/16/2016 CLINICAL DATA:  History of bilateral salpingectomy.  Abdominal pain. EXAM: TRANSABDOMINAL AND TRANSVAGINAL ULTRASOUND OF PELVIS TECHNIQUE: Both transabdominal and transvaginal ultrasound examinations of the pelvis were performed. Transabdominal technique was performed for global imaging of the pelvis including uterus, ovaries, adnexal regions, and pelvic cul-de-sac. It was necessary to proceed with endovaginal exam following the transabdominal exam to visualize the ovaries. COMPARISON:  CT from 12/16/2016 FINDINGS: Uterus Measurements: 5.6 x 3.5 x 3.6 cm.  Retroflexed. Endometrium Thickness: 3.6 mm.  No focal abnormality visualized. Right ovary Measurements: Not visualize. No adnexal mass noted. Left ovary Measurements: 2.7 x 1.6 x 1.6 cm. Normal appearance/no adnexal mass. Other findings Small volume of free fluid noted within the pelvis. IMPRESSION: 1. No acute findings identified. 2. Small volume of free fluid in the pelvis.  Nonspecific. Electronically Signed   By: Signa Kell M.D.   On: 12/16/2016 20:50   Ct Abdomen Pelvis W Contrast  Result Date: 12/16/2016 CLINICAL DATA:  Abdominal pain nausea and vomiting EXAM: CT ABDOMEN AND PELVIS WITH CONTRAST TECHNIQUE: Multidetector CT imaging of the abdomen and pelvis was performed using the standard protocol following bolus administration of intravenous contrast. CONTRAST:  ISOVUE-300 IOPAMIDOL (ISOVUE-300) INJECTION 61% COMPARISON:  03/12/2008 FINDINGS: Lower chest: Lung bases demonstrate no acute consolidation or effusion. Normal heart size. Hepatobiliary: No focal liver abnormality is seen. No gallstones, gallbladder wall thickening, or biliary dilatation. Pancreas:  Unremarkable. No pancreatic ductal dilatation or surrounding inflammatory changes. Spleen: Normal in size without focal abnormality. Adrenals/Urinary Tract: Adrenal glands are within normal limits. Left perinephric fat stranding. Heterogenous hypodensities within the left renal cortex. Mild urothelial enhancement on the left with slight prominence of left ureter. Bladder unremarkable. Stomach/Bowel: Stomach is within normal limits. Appendix appears normal. No evidence of bowel wall thickening, distention, or inflammatory changes. Vascular/Lymphatic: No significant vascular findings are present. No enlarged abdominal or pelvic lymph nodes. Reproductive: Uterus and bilateral adnexa are unremarkable. Other:  Negative for free air or free fluid. Musculoskeletal: Stable 7 mm anterolisthesis of L4 on L5. 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 IMPRESSION: 1. Left perinephric edema with heterogenous enhancement of left kidney and multifocal cortical hypodensity, collective findings are concerning for an acute pyelonephritis. Slight prominence of left ureter with urothelial enhancement, no definite stones seen along course of left ureter. 2. Negative appendix 3. Stable anterolisthesis of L4 on L5 with chronic appearing pars defects at L4 and L5 Electronically Signed   By: Jasmine Pang M.D.   On: 12/16/2016 02:18    Garnette Gunner, MD 12/18/2016, 7:25 AM PGY-1, [facility] Family Medicine FPTS Intern pager: [phone], text pages welcome

## 2016-12-19 ENCOUNTER — Inpatient Hospital Stay (HOSPITAL_COMMUNITY): Payer: 59

## 2016-12-19 DIAGNOSIS — R9431 Abnormal electrocardiogram [ECG] [EKG]: Secondary | ICD-10-CM

## 2016-12-19 LAB — ECHOCARDIOGRAM COMPLETE
E decel time: 195 msec
E/e' ratio: 9.57
FS: 36 % (ref 28–44)
Height: 62 in
IVS/LV PW RATIO, ED: 1
LA ID, A-P, ES: 31 mm
LA diam end sys: 31 mm
LA diam index: 1.97 cm/m2
LA vol A4C: 43.5 ml
LA vol index: 29.6 mL/m2
LA vol: 46.5 mL
LV E/e' medial: 9.57
LV E/e'average: 9.57
LV PW d: 8 mm — AB (ref 0.6–1.1)
LV e' LATERAL: 11.7 cm/s
LVOT SV: 82 mL
LVOT VTI: 26.1 cm
LVOT area: 3.14 cm2
LVOT diameter: 20 mm
LVOT peak grad rest: 3 mmHg
LVOT peak vel: 89.2 cm/s
Lateral S' vel: 12.8 cm/s
MV Dec: 195
MV Peak grad: 5 mmHg
MV pk A vel: 72.2 m/s
MV pk E vel: 112 m/s
TAPSE: 24 mm
TDI e' lateral: 11.7
TDI e' medial: 8.25
Weight: 2000 oz

## 2016-12-19 LAB — CBC
HEMATOCRIT: 34.9 % — AB (ref 36.0–46.0)
HEMOGLOBIN: 11.1 g/dL — AB (ref 12.0–15.0)
MCH: 23.5 pg — AB (ref 26.0–34.0)
MCHC: 31.8 g/dL (ref 30.0–36.0)
MCV: 73.8 fL — AB (ref 78.0–100.0)
PLATELETS: 194 10*3/uL (ref 150–400)
RBC: 4.73 MIL/uL (ref 3.87–5.11)
RDW: 15.5 % (ref 11.5–15.5)
WBC: 6.1 10*3/uL (ref 4.0–10.5)

## 2016-12-19 LAB — BASIC METABOLIC PANEL
Anion gap: 9 (ref 5–15)
BUN: 6 mg/dL (ref 6–20)
CHLORIDE: 108 mmol/L (ref 101–111)
CO2: 23 mmol/L (ref 22–32)
Calcium: 8.5 mg/dL — ABNORMAL LOW (ref 8.9–10.3)
Creatinine, Ser: 0.68 mg/dL (ref 0.44–1.00)
GFR calc Af Amer: >60 mL/min (ref >60)
GLUCOSE: 91 mg/dL (ref 65–99)
POTASSIUM: 3.5 mmol/L (ref 3.5–5.1)
Sodium: 140 mmol/L (ref 135–145)

## 2016-12-19 MED ORDER — AMOXICILLIN 500 MG PO CAPS
500.0000 mg | ORAL_CAPSULE | Freq: Three times a day (TID) | ORAL | Status: DC
  Administered 2016-12-19 – 2016-12-20 (×6): 500 mg via ORAL
  Filled 2016-12-19 (×7): qty 1

## 2016-12-19 NOTE — Discharge Summary (Signed)
Family Medicine Teaching [facility] Discharge Summary  Patient name: [name] Medical record number: [id number] Date of birth: 11-27-1980 Age: 36 y.o. Gender: female Date of Admission: 12/15/2016  Date of Discharge: 12/19/2016 Admitting Physician: Doreene Eland, MD  Primary Care Provider: Renne Musca, MD Consultants: Cardiology  Indication for Hospitalization: Pyelonephritis  Discharge Diagnoses/Problem List:  E. Coli Urosepsis and Pyelonephritis EKG changes with elevated troponin Hypokalemia Anemia  Disposition: Home  Discharge Condition: Stable  Discharge Exam:  Physical Exam: General: NAD Cardiovascular: rrr, no mrg Respiratory: CTAB, no increased work of breathing Abdomen: mildy tender RUQ, radiating to back and to shoulder, no rebound/guarding Extremities: no lower extremity edema, 2+ dp  Brief Hospital Course:  Patient was admitted to the FPTS for worsening epigastric abdominal pain radiating to right flank and shoulder. She was afebrile, hypotensive. Also febrile to 102.4 and tachycardic 110s, BP 80s/60s. Code Sepsis was initiated and patient was started and patient was given broad spectrum antibiotics and fluids. Labs significant for leukocytosis of 14.8, lactic acid 0.93, hyponatremia 133, hypokalemia 2.9. EKG showed T wave inversion. And she had mildly elevated troponin of 0.04. UA showed many bacteria, 80 ketones, moderate leukocytes. bHCG quant 5. Abdominal CT showed Left perinephric edema with heterogenous enhancement of left kidney and multifocal cortical hypodensity, collective findings concerning for an acute pyelonephritis. Later blood cultures showed E. Coli bacteremia. Given mildly elevated troponins and EKG changes, cardiology was consulted to rule out ACS. Echocardiogram was performed and found to be normal. Also, EKG changes were consistant with prior EKGs from 2016. The elevated troponins were thought to be demand ischemia from urosepsis. On  admission patient had Elevated bHCG of 5, repeat bHCG was 2. Pt is s/p tubal ligation. TVUS was showed no evidence of developing pregnancy or other patholgoy. Is is thought that E coli sepsis can also cause false positive Beta Hcg. During her stay, patient was mildly anemic at around 11.0. Her hemoglobin was stable on discharge. Anemia Panel: Iron 10, TIBC 242, Saturation 4, Ferritin 323. Pt had a h/o gastric ulcers. Denied h/o diarrhea, dark tary stools, hematochezia. She had a FOBT that was positive.  Patient was started on Protonix.   Issues for Follow Up:  1. Outpatient GI - follow up for positive FOBT. Please have her avoid any NSAIDs for pain management. 2. Amoxicillin - Please ensure patient has finished full course of amoxicillin 3. Anemia - Please recheck CBC to see if stable.  4. Hypokalemia - Patient had persistant hypokalemia on admission. Patient was discharged with stable K wnl. Please recheck BMP to ensure this has remained stable.   Significant Procedures: None  Significant Labs and Imaging:   Recent Labs Lab 12/18/16 0607 12/19/16 0416 12/20/16 0641  WBC 8.0 6.1 8.7  HGB 10.7* 11.1* 11.5*  HCT 33.3* 34.9* 35.6*  PLT 151 194 230    Recent Labs Lab 12/15/16 1942  12/16/16 0830 12/16/16 1656 12/17/16 1040 12/18/16 0607 12/19/16 0416 12/20/16 0641  NA 133*  < > 138 140 135 137 140 138  K 2.9*  < > 3.3* 3.3* 3.0* 3.7 3.5 3.6  CL 100*  < > 108 112* 107 107 108 104  CO2 21*  < > 20* 20* 18* 20* 23 24  GLUCOSE 99  < > 101* 109* 236* 98 91 93  BUN 10  < > 6 <5* 5* <5* 6 7  CREATININE 0.77  < > 0.76 0.75 0.92 0.74 0.68 0.65  CALCIUM 8.6*  < > 7.4*  7.9* 8.0* 8.5* 8.5* 8.6*  MG  --   --  1.5*  --   --   --   --   --   PHOS  --   --  3.0  --   --   --   --   --   ALKPHOS 66  --   --   --   --   --   --   --   AST 22  --   --   --   --   --   --   --   ALT 14  --   --   --   --   --   --   --   ALBUMIN 3.5  --   --   --   --   --   --   --   < > = values in this  interval not displayed.  ECHO: Normal LV wall thickness with LVEF 55-60% and normal diastolic   function. Trivial mitral and tricuspid regurgitation. No obvious   valvular vegetations noted.  Results/Tests Pending at Time of Discharge: None  Discharge Medications:  Allergies as of 12/20/2016   No Known Allergies     Medication List    STOP taking these medications   cetirizine 10 MG tablet Commonly known as:  ZYRTEC   hydrocortisone 2.5 % rectal cream Commonly known as:  ANUSOL-HC   ibuprofen 200 MG tablet Commonly known as:  ADVIL,MOTRIN   Olopatadine HCl 0.2 % Soln Commonly known as:  PATADAY   omeprazole 20 MG capsule Commonly known as:  PRILOSEC   polyethylene glycol powder powder Commonly known as:  GLYCOLAX/MIRALAX     TAKE these medications   acetaminophen 325 MG tablet Commonly known as:  TYLENOL Take 650 mg by mouth every 6 (six) hours as needed for mild pain.   amoxicillin 500 MG capsule Commonly known as:  AMOXIL Take 1 capsule (500 mg total) by mouth every 8 (eight) hours.   pantoprazole 40 MG tablet Commonly known as:  PROTONIX Take 1 tablet (40 mg total) by mouth daily.   witch hazel-glycerin pad Commonly known as:  TUCKS Apply topically as needed for itching.       Discharge Instructions: Please refer to Patient Instructions section of EMR for full details.  Patient was counseled important signs and symptoms that should prompt return to medical care, changes in medications, dietary instructions, activity restrictions, and follow up appointments.   Follow-Up Appointments: Follow-up Information    Renne Musca, MD. Go on 12/25/2016.   Specialty:  Family Medicine Why:  Appointment at 9:45AM Contact information: [address] [place] [address] [phone]           Garnette Gunner, MD 12/21/2016, 2:39 PM PGY-1, [facility] Family Medicine

## 2016-12-19 NOTE — Progress Notes (Signed)
Progress Note  Patient Name: [name] Date of Encounter: 12/19/2016  Primary Cardiologist: Hilty  Subjective   Sleepy, no CP, no SOB.   Inpatient Medications    Scheduled Meds: . magnesium gluconate  500 mg Oral Once  . ondansetron (ZOFRAN) IV  4 mg Intravenous Q8H  . pantoprazole  40 mg Oral Daily  . sodium chloride flush  3 mL Intravenous Q12H   Continuous Infusions: . sodium chloride 100 mL/hr (12/19/16 0517)  . cefTRIAXone (ROCEPHIN)  IV 2 g (12/18/16 1859)   PRN Meds: acetaminophen, ibuprofen   Vital Signs    Vitals:   12/18/16 0512 12/18/16 1429 12/18/16 2244 12/19/16 0614  BP: 105/75 99/80 98/66  111/63  Pulse: 80 78 64 (!) 57  Resp: (!) 26 16 18 17   Temp: 99.6 F (37.6 C) 98.6 F (37 C) 98.2 F (36.8 C) (!) 97.5 F (36.4 C)  TempSrc:  Oral Oral Oral  SpO2: 96% 100% 99% 100%  Weight:      Height:       No intake or output data in the 24 hours ending 12/19/16 0853 Filed Weights   12/15/16 1933  Weight: 125 lb (56.7 kg)    Telemetry    No adverse rhythms - Personally Reviewed  ECG    TWI V2-3, NSR - Personally Reviewed  Physical Exam   GEN: No acute distress.   Neck: No JVD Cardiac: RRR, no murmurs, rubs, or gallops.  Respiratory: Clear to auscultation bilaterally. GI: Soft, nontender, non-distended  MS: No edema; No deformity. Neuro:  Nonfocal  Psych: Normal affect   Labs    Chemistry Recent Labs Lab 12/15/16 1942  12/17/16 1040 12/18/16 0607 12/19/16 0416  NA 133*  < > 135 137 140  K 2.9*  < > 3.0* 3.7 3.5  CL 100*  < > 107 107 108  CO2 21*  < > 18* 20* 23  GLUCOSE 99  < > 236* 98 91  BUN 10  < > 5* <5* 6  CREATININE 0.77  < > 0.92 0.74 0.68  CALCIUM 8.6*  < > 8.0* 8.5* 8.5*  PROT 7.2  --   --   --   --   ALBUMIN 3.5  --   --   --   --   AST 22  --   --   --   --   ALT 14  --   --   --   --   ALKPHOS 66  --   --   --   --   BILITOT 1.1  --   --   --   --   GFRNONAA >60  < > >60 >60 >60  GFRAA >60  < > >60 >60  >60  ANIONGAP 12  < > 10 10 9   < > = values in this interval not displayed.   Hematology Recent Labs Lab 12/17/16 0128 12/18/16 0607 12/19/16 0416  WBC 10.9* 8.0 6.1  RBC 4.81  4.99 4.60 4.73  HGB 11.6* 10.7* 11.1*  HCT 36.0 33.3* 34.9*  MCV 74.8* 72.4* 73.8*  MCH 24.1* 23.3* 23.5*  MCHC 32.2 32.1 31.8  RDW 15.3 15.0 15.5  PLT 100* 151 194    Cardiac Enzymes Recent Labs Lab 12/16/16 0830 12/16/16 1514 12/16/16 2117 12/17/16 0128  TROPONINI 0.04* 0.07* 0.04* 0.03*   No results for input(s): TROPIPOC in the last 168 hours.   BNPNo results for input(s): BNP, PROBNP in the last 168 hours.   DDimer  Recent Labs Lab 12/16/16 0411  DDIMER 1.86*     Radiology    No results found.  Cardiac Studies   ECHO P  Patient Profile     36 y.o. female with abnormal ECG (TWI V3), Troponin low level increase.  Assessment & Plan    Troponin elevation/abnormal ECG/pyelonephritis  - unclear etiology, no angina. Does not appear to be ACS related.   - likely a degree of demand ischemia in setting of fever 102.   - No coronary calcification  - old ecg similar in 2013  - ? Hypertrophic cardiomyopathy  - Await ECHO.   Signed, Donato Schultz, MD  12/19/2016, 8:53 AM

## 2016-12-19 NOTE — Progress Notes (Signed)
Family Medicine Teaching Service Daily Progress Note Intern Pager: [phone]  Patient name: [name] Medical record number: [id number] Date of birth: 1981/03/02 Age: 36 y.o. Gender: female  Primary Care Provider: Renne Musca, MD Consultants: None Code Status: Full  Pt Overview and Major Events to Date:  [name] is a 36 y.o. female presenting with 2 day h/o worsening epigastric abdominal pain with emesis and fever. PMH is significant for Gastric Ulcer, Lumbago, Pars defect of lumbar spine.   Assessment and Plan: [name] is a 36 y.o. female presenting with 2 day h/o worsening epigastric abdominal pain with emesis and fever. PMH is significant for Gastric Ulcer, Lumbago, Pars defect of lumbar spine.   Abdominal pain: stable. Afebrile for > 48 hours. WBC wnl at 6.1. bHCG quant 5. Abdominal CT showed Left perinephric edema with heterogenous enhancement of left kidney and multifocal cortical hypodensity, collective findings are concerning for an acute pyelonephritis.  Most likely urosepsis w/ GNR bacteremia 2/2 to pyelo. Phos wnl. Mg low normal 1.5 on 8/15.  -Blood cultures and Urine Cultures show pan-susceptible E. Coli  -Zosyn d/c'd on 08/16, narrowed to ceftriaxone IV on 08/16 d/c--> pt on oral amoxicillin for 7 days s/p afebrile for 48 hours -Tylenol and Ibuprofen prn  -mIVF NS @ 100 mL/hr  EKG changes with elevated troponin: Cardiology following. Less likely ACS likely demand ischemia from urosepsis - Obtaining Echo   Elevated bHCG. Improved. Repeat bHCG was 2. Mildly elevated on admission at 5. Pt is s/p tubal ligation. E coli sepsis can also cause false positive Beta Hcg. Will closely monitor abdominal exams. TVUS revealed No acute findings. Revealed small volume of free fluid in the pelvis.  Nonspecific.  - continue to monitor abdominal exam  Hypokalemia. Improved. Last K+ 3.5 on 8/18; 2.9 on admission.  -trend K -replete as needed  Anemia. Stable. Mild at 11.0  on admission--> 11.1 08/17. Baseline around 12. No menses x 4 years per her report. Anemia Panel: Iron 10, TIBC 242, Saturation 4, Ferritin 323. Mixed anemia picture consistent with iron deficiency and possible inflammation/anemia chronic disease/Folate/B12 wnl. H/o gastric ulcers. Denies h/o diarrhea, dark tary stools, hematochezia.  -cont to trend CBC -FOBT positive 8/15 -->d/c ibuprofen for possible worsening of gi bleed -started PPI -Consider outpt GI for further evaluation and management  Tachycardia. Improved. No has nl rate.  Pt presented with persistent tachycardia. Pt was hypotensive as well. Likely due to urosepsis w/ GNR bacteremia.  -Continue to monitor heart rate and respiratory status  Hypotension. Stable. BP in AM 105/75. BP on admit 80s/50s. Has improved s/p fluid bolus yesterday afternoon.  -cont to monitor BP  FEN/GI: Full Prophylaxis: LMWH  Disposition: inpatient management of GNR sepsis, possible discharge tomorrow if clinically improving on PO antibiotics  Subjective:  Pt denies translation services and says she doesn't need them, she did say if there was something she did not understand, that she would inform me and I could get an interpreter given that her daughter was not in the room. Pt says she feels chills and is still having abominal pain that is radiating to her back. She says she has had a soft bowel movement. Denies cp, dizziness, sob.   Objective: Temp:  [97.5 F (36.4 C)-98.6 F (37 C)] 97.5 F (36.4 C) (08/18 0614) Pulse Rate:  [57-78] 57 (08/18 0614) Resp:  [16-18] 17 (08/18 0614) BP: (98-111)/(63-80) 111/63 (08/18 0614) SpO2:  [99 %-100 %] 100 % (08/18 1308) Physical Exam: General: NAD Cardiovascular: rrr,  no mrg Respiratory: CTAB, no increased work of breathing Abdomen: mildy tender RUQ, radiating to back and to shoulder, no rebound/guarding Extremities: no lower extremity edema, 2+ dp  Laboratory:  Recent Labs Lab 12/17/16 0128  12/18/16 0607 12/19/16 0416  WBC 10.9* 8.0 6.1  HGB 11.6* 10.7* 11.1*  HCT 36.0 33.3* 34.9*  PLT 100* 151 194    Recent Labs Lab 12/15/16 1942  12/17/16 1040 12/18/16 0607 12/19/16 0416  NA 133*  < > 135 137 140  K 2.9*  < > 3.0* 3.7 3.5  CL 100*  < > 107 107 108  CO2 21*  < > 18* 20* 23  BUN 10  < > 5* <5* 6  CREATININE 0.77  < > 0.92 0.74 0.68  CALCIUM 8.6*  < > 8.0* 8.5* 8.5*  PROT 7.2  --   --   --   --   BILITOT 1.1  --   --   --   --   ALKPHOS 66  --   --   --   --   ALT 14  --   --   --   --   AST 22  --   --   --   --   GLUCOSE 99  < > 236* 98 91  < > = values in this interval not displayed.    Imaging/Diagnostic Tests: Ct Angio Chest Pe W Or Wo Contrast  Result Date: 12/16/2016 CLINICAL DATA:  Elevated D-dimer level. EXAM: CT ANGIOGRAPHY CHEST WITH CONTRAST TECHNIQUE: Multidetector CT imaging of the chest was performed using the standard protocol during bolus administration of intravenous contrast. Multiplanar CT image reconstructions and MIPs were obtained to evaluate the vascular anatomy. CONTRAST:  70 mL of Isovue 370 intravenously. COMPARISON:  Radiograph of August 03, 2011. FINDINGS: Cardiovascular: Satisfactory opacification of the pulmonary arteries to the segmental level. No evidence of pulmonary embolism. Normal heart size. No pericardial effusion. Mediastinum/Nodes: No enlarged mediastinal, hilar, or axillary lymph nodes. Thyroid gland, trachea, and esophagus demonstrate no significant findings. Lungs/Pleura: Lungs are clear. No pleural effusion or pneumothorax. Upper Abdomen: No acute abnormality. Musculoskeletal: No chest wall abnormality. No acute or significant osseous findings. Review of the MIP images confirms the above findings. IMPRESSION: No definite evidence of pulmonary embolus. No definite abnormality seen in the chest. Electronically Signed   By: Lupita Raider, M.D.   On: 12/16/2016 07:36   US Transvaginal Non-ob  Result Date: 12/16/2016 CLINICAL  DATA:  History of bilateral salpingectomy.  Abdominal pain. EXAM: TRANSABDOMINAL AND TRANSVAGINAL ULTRASOUND OF PELVIS TECHNIQUE: Both transabdominal and transvaginal ultrasound examinations of the pelvis were performed. Transabdominal technique was performed for global imaging of the pelvis including uterus, ovaries, adnexal regions, and pelvic cul-de-sac. It was necessary to proceed with endovaginal exam following the transabdominal exam to visualize the ovaries. COMPARISON:  CT from 12/16/2016 FINDINGS: Uterus Measurements: 5.6 x 3.5 x 3.6 cm.  Retroflexed. Endometrium Thickness: 3.6 mm.  No focal abnormality visualized. Right ovary Measurements: Not visualize. No adnexal mass noted. Left ovary Measurements: 2.7 x 1.6 x 1.6 cm. Normal appearance/no adnexal mass. Other findings Small volume of free fluid noted within the pelvis. IMPRESSION: 1. No acute findings identified. 2. Small volume of free fluid in the pelvis.  Nonspecific. Electronically Signed   By: Signa Kell M.D.   On: 12/16/2016 20:50   US Pelvis Complete  Result Date: 12/16/2016 CLINICAL DATA:  History of bilateral salpingectomy.  Abdominal pain. EXAM: TRANSABDOMINAL AND TRANSVAGINAL ULTRASOUND OF  PELVIS TECHNIQUE: Both transabdominal and transvaginal ultrasound examinations of the pelvis were performed. Transabdominal technique was performed for global imaging of the pelvis including uterus, ovaries, adnexal regions, and pelvic cul-de-sac. It was necessary to proceed with endovaginal exam following the transabdominal exam to visualize the ovaries. COMPARISON:  CT from 12/16/2016 FINDINGS: Uterus Measurements: 5.6 x 3.5 x 3.6 cm.  Retroflexed. Endometrium Thickness: 3.6 mm.  No focal abnormality visualized. Right ovary Measurements: Not visualize. No adnexal mass noted. Left ovary Measurements: 2.7 x 1.6 x 1.6 cm. Normal appearance/no adnexal mass. Other findings Small volume of free fluid noted within the pelvis. IMPRESSION: 1. No acute  findings identified. 2. Small volume of free fluid in the pelvis.  Nonspecific. Electronically Signed   By: Signa Kell M.D.   On: 12/16/2016 20:50   Ct Abdomen Pelvis W Contrast  Result Date: 12/16/2016 CLINICAL DATA:  Abdominal pain nausea and vomiting EXAM: CT ABDOMEN AND PELVIS WITH CONTRAST TECHNIQUE: Multidetector CT imaging of the abdomen and pelvis was performed using the standard protocol following bolus administration of intravenous contrast. CONTRAST:  ISOVUE-300 IOPAMIDOL (ISOVUE-300) INJECTION 61% COMPARISON:  03/12/2008 FINDINGS: Lower chest: Lung bases demonstrate no acute consolidation or effusion. Normal heart size. Hepatobiliary: No focal liver abnormality is seen. No gallstones, gallbladder wall thickening, or biliary dilatation. Pancreas: Unremarkable. No pancreatic ductal dilatation or surrounding inflammatory changes. Spleen: Normal in size without focal abnormality. Adrenals/Urinary Tract: Adrenal glands are within normal limits. Left perinephric fat stranding. Heterogenous hypodensities within the left renal cortex. Mild urothelial enhancement on the left with slight prominence of left ureter. Bladder unremarkable. Stomach/Bowel: Stomach is within normal limits. Appendix appears normal. No evidence of bowel wall thickening, distention, or inflammatory changes. Vascular/Lymphatic: No significant vascular findings are present. No enlarged abdominal or pelvic lymph nodes. Reproductive: Uterus and bilateral adnexa are unremarkable. Other:  Negative for free air or free fluid. Musculoskeletal: Stable 7 mm anterolisthesis of L4 on L5. 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 IMPRESSION: 1. Left perinephric edema with heterogenous enhancement of left kidney and multifocal cortical hypodensity, collective findings are concerning for an acute pyelonephritis. Slight prominence of left ureter with urothelial enhancement, no definite stones seen along course of left ureter. 2. Negative  appendix 3. Stable anterolisthesis of L4 on L5 with chronic appearing pars defects at L4 and L5 Electronically Signed   By: Jasmine Pang M.D.   On: 12/16/2016 02:18    Jennamarie Goings, [place], DO 12/19/2016, 12:58 PM PGY-1, [facility] FPTS Intern pager: [phone], text pages welcome

## 2016-12-19 NOTE — Progress Notes (Signed)
  Echocardiogram 2D Echocardiogram has been performed.  Delcie Roch 12/19/2016, 12:41 PM

## 2016-12-20 LAB — BASIC METABOLIC PANEL WITH GFR
Anion gap: 10 (ref 5–15)
BUN: 7 mg/dL (ref 6–20)
CO2: 24 mmol/L (ref 22–32)
Calcium: 8.6 mg/dL — ABNORMAL LOW (ref 8.9–10.3)
Chloride: 104 mmol/L (ref 101–111)
Creatinine, Ser: 0.65 mg/dL (ref 0.44–1.00)
GFR calc Af Amer: >60 mL/min (ref >60)
GFR calc non Af Amer: >60 mL/min (ref >60)
Glucose, Bld: 93 mg/dL (ref 65–99)
Potassium: 3.6 mmol/L (ref 3.5–5.1)
Sodium: 138 mmol/L (ref 135–145)

## 2016-12-20 LAB — CBC
HEMATOCRIT: 35.6 % — AB (ref 36.0–46.0)
HEMOGLOBIN: 11.5 g/dL — AB (ref 12.0–15.0)
MCH: 23.6 pg — AB (ref 26.0–34.0)
MCHC: 32.3 g/dL (ref 30.0–36.0)
MCV: 73 fL — AB (ref 78.0–100.0)
Platelets: 230 10*3/uL (ref 150–400)
RBC: 4.88 MIL/uL (ref 3.87–5.11)
RDW: 15.1 % (ref 11.5–15.5)
WBC: 8.7 10*3/uL (ref 4.0–10.5)

## 2016-12-20 MED ORDER — AMOXICILLIN 500 MG PO CAPS: 500.0000 mg | ORAL_CAPSULE | Freq: Three times a day (TID) | ORAL | 0 refills | Status: AC

## 2016-12-20 MED ORDER — WITCH HAZEL-GLYCERIN EX PADS: MEDICATED_PAD | CUTANEOUS | 12 refills | Status: DC | PRN

## 2016-12-20 MED ORDER — PANTOPRAZOLE SODIUM 40 MG PO TBEC: 40.0000 mg | DELAYED_RELEASE_TABLET | Freq: Every day | ORAL | 0 refills | Status: DC

## 2016-12-20 MED ORDER — WITCH HAZEL-GLYCERIN EX PADS
MEDICATED_PAD | CUTANEOUS | Status: DC | PRN
  Filled 2016-12-20: qty 100

## 2016-12-20 NOTE — Progress Notes (Signed)
Family Medicine Teaching Service Daily Progress Note Intern Pager: [phone]  Patient name: [name] Medical record number: [id number] Date of birth: 08/13/1980 Age: 36 y.o. Gender: female  Primary Care Provider: Renne Musca, MD Consultants: None Code Status: Full  Pt Overview and Major Events to Date:  [name] is a 36 y.o. female presenting with 2 day h/o worsening epigastric abdominal pain with emesis and fever. PMH is significant for Gastric Ulcer, Lumbago, Pars defect of lumbar spine.   Assessment and Plan: [name] is a 36 y.o. female presenting with 2 day h/o worsening epigastric abdominal pain with emesis and fever. PMH is significant for Gastric Ulcer, Lumbago, Pars defect of lumbar spine.   Abdominal pain: stable. Afebrile for > 48 hours. WBC wnl at8.7. bHCG quant 5 > 2. Abdominal CT showed Left perinephric edema with heterogenous enhancement of left kidney and multifocal cortical hypodensity, collective findings are concerning for an acute pyelonephritis.  Most likely urosepsis w/ GNR bacteremia 2/2 to pyelo. Phos wnl. Mg low normal 1.5 on 8/15.  -Blood cultures and Urine Cultures show pan-susceptible E. Coli  -Zosyn d/c'd on 08/16, narrowed to ceftriaxone IV on 08/16 d/c--> pt on oral amoxicillin for 7 days s/p afebrile for 48 hours -Tylenol prn   EKG changes with elevated troponin: Cardiology following. Less likely ACS likely demand ischemia from urosepsis - Obtaining Echo   Elevated bHCG. Improved. Repeat bHCG was 2. Mildly elevated on admission at 5. Pt is s/p tubal ligation. E coli sepsis can also cause false positive Beta Hcg. Will closely monitor abdominal exams. TVUS revealed No acute findings. Revealed small volume of free fluid in the pelvis.  Nonspecific.  - continue to monitor abdominal exam  Hypokalemia. Improved. Last K+ 3.5 on 8/18; 2.9 on admission.  -trend K -replete as needed  Anemia. Stable. Mild at 11.0 on admission--> 11.1 08/17.  Baseline around 12. No menses x 4 years per her report. Anemia Panel: Iron 10, TIBC 242, Saturation 4, Ferritin 323. Mixed anemia picture consistent with iron deficiency and possible inflammation/anemia chronic disease/Folate/B12 wnl. H/o gastric ulcers. Denies h/o diarrhea, dark tary stools, hematochezia.  -cont to trend CBC -FOBT positive 8/15 -->d/c ibuprofen for possible worsening of gi bleed -started PPI -Consider outpt GI for further evaluation and management  Tachycardia. Improved. No has nl rate.  Pt presented with persistent tachycardia. Pt was hypotensive as well. Likely due to urosepsis w/ GNR bacteremia.  -Continue to monitor heart rate and respiratory status  Hypotension. Stable. BP in AM 105/75. BP on admit 80s/50s. Has improved s/p fluid bolus yesterday afternoon.  -cont to monitor BP  Anus itching. Pt endorsed itching around her anus after a soft bowel movement. It is improved now.  -medicated witch hazel wipes prn  FEN/GI: Full Prophylaxis: LMWH  Disposition: discharge   Subjective:  Pt resting comfortably. Endorses mild abdominal pain, but improving. Complained of itching around her anus last night after a bowel movement. Itching has improved. Denies SOB, itching, whelps, rash.  Denies cp, dizziness, sob.   Objective: Temp:  [98.1 F (36.7 C)-98.4 F (36.9 C)] 98.1 F (36.7 C) (08/19 0606) Pulse Rate:  [61-70] 61 (08/19 0606) Resp:  [18] 18 (08/19 0606) BP: (107-127)/(69-74) 127/69 (08/19 0606) SpO2:  [98 %-99 %] 99 % (08/19 0606) Physical Exam: General: NAD, alert, resting in bed Cardiovascular: rrr, no mrg Respiratory: CTAB, no increased work of breathing Abdomen: mildy tender RUQ, radiating to back and to shoulder, no rebound/guarding Extremities: no lower extremity edema, 2+  dp  Laboratory:  Recent Labs Lab 12/18/16 0607 12/19/16 0416 12/20/16 0641  WBC 8.0 6.1 8.7  HGB 10.7* 11.1* 11.5*  HCT 33.3* 34.9* 35.6*  PLT 151 194 230    Recent  Labs Lab 12/15/16 1942  12/18/16 0607 12/19/16 0416 12/20/16 0641  NA 133*  < > 137 140 138  K 2.9*  < > 3.7 3.5 3.6  CL 100*  < > 107 108 104  CO2 21*  < > 20* 23 24  BUN 10  < > <5* 6 7  CREATININE 0.77  < > 0.74 0.68 0.65  CALCIUM 8.6*  < > 8.5* 8.5* 8.6*  PROT 7.2  --   --   --   --   BILITOT 1.1  --   --   --   --   ALKPHOS 66  --   --   --   --   ALT 14  --   --   --   --   AST 22  --   --   --   --   GLUCOSE 99  < > 98 91 93  < > = values in this interval not displayed.    Imaging/Diagnostic Tests: Ct Angio Chest Pe W Or Wo Contrast  Result Date: 12/16/2016 CLINICAL DATA:  Elevated D-dimer level. EXAM: CT ANGIOGRAPHY CHEST WITH CONTRAST TECHNIQUE: Multidetector CT imaging of the chest was performed using the standard protocol during bolus administration of intravenous contrast. Multiplanar CT image reconstructions and MIPs were obtained to evaluate the vascular anatomy. CONTRAST:  70 mL of Isovue 370 intravenously. COMPARISON:  Radiograph of August 03, 2011. FINDINGS: Cardiovascular: Satisfactory opacification of the pulmonary arteries to the segmental level. No evidence of pulmonary embolism. Normal heart size. No pericardial effusion. Mediastinum/Nodes: No enlarged mediastinal, hilar, or axillary lymph nodes. Thyroid gland, trachea, and esophagus demonstrate no significant findings. Lungs/Pleura: Lungs are clear. No pleural effusion or pneumothorax. Upper Abdomen: No acute abnormality. Musculoskeletal: No chest wall abnormality. No acute or significant osseous findings. Review of the MIP images confirms the above findings. IMPRESSION: No definite evidence of pulmonary embolus. No definite abnormality seen in the chest. Electronically Signed   By: Lupita Raider, M.D.   On: 12/16/2016 07:36   US Transvaginal Non-ob  Result Date: 12/16/2016 CLINICAL DATA:  History of bilateral salpingectomy.  Abdominal pain. EXAM: TRANSABDOMINAL AND TRANSVAGINAL ULTRASOUND OF PELVIS TECHNIQUE:  Both transabdominal and transvaginal ultrasound examinations of the pelvis were performed. Transabdominal technique was performed for global imaging of the pelvis including uterus, ovaries, adnexal regions, and pelvic cul-de-sac. It was necessary to proceed with endovaginal exam following the transabdominal exam to visualize the ovaries. COMPARISON:  CT from 12/16/2016 FINDINGS: Uterus Measurements: 5.6 x 3.5 x 3.6 cm.  Retroflexed. Endometrium Thickness: 3.6 mm.  No focal abnormality visualized. Right ovary Measurements: Not visualize. No adnexal mass noted. Left ovary Measurements: 2.7 x 1.6 x 1.6 cm. Normal appearance/no adnexal mass. Other findings Small volume of free fluid noted within the pelvis. IMPRESSION: 1. No acute findings identified. 2. Small volume of free fluid in the pelvis.  Nonspecific. Electronically Signed   By: Signa Kell M.D.   On: 12/16/2016 20:50   US Pelvis Complete  Result Date: 12/16/2016 CLINICAL DATA:  History of bilateral salpingectomy.  Abdominal pain. EXAM: TRANSABDOMINAL AND TRANSVAGINAL ULTRASOUND OF PELVIS TECHNIQUE: Both transabdominal and transvaginal ultrasound examinations of the pelvis were performed. Transabdominal technique was performed for global imaging of the pelvis including uterus, ovaries, adnexal  regions, and pelvic cul-de-sac. It was necessary to proceed with endovaginal exam following the transabdominal exam to visualize the ovaries. COMPARISON:  CT from 12/16/2016 FINDINGS: Uterus Measurements: 5.6 x 3.5 x 3.6 cm.  Retroflexed. Endometrium Thickness: 3.6 mm.  No focal abnormality visualized. Right ovary Measurements: Not visualize. No adnexal mass noted. Left ovary Measurements: 2.7 x 1.6 x 1.6 cm. Normal appearance/no adnexal mass. Other findings Small volume of free fluid noted within the pelvis. IMPRESSION: 1. No acute findings identified. 2. Small volume of free fluid in the pelvis.  Nonspecific. Electronically Signed   By: Signa Kell M.D.   On:  12/16/2016 20:50   Ct Abdomen Pelvis W Contrast  Result Date: 12/16/2016 CLINICAL DATA:  Abdominal pain nausea and vomiting EXAM: CT ABDOMEN AND PELVIS WITH CONTRAST TECHNIQUE: Multidetector CT imaging of the abdomen and pelvis was performed using the standard protocol following bolus administration of intravenous contrast. CONTRAST:  ISOVUE-300 IOPAMIDOL (ISOVUE-300) INJECTION 61% COMPARISON:  03/12/2008 FINDINGS: Lower chest: Lung bases demonstrate no acute consolidation or effusion. Normal heart size. Hepatobiliary: No focal liver abnormality is seen. No gallstones, gallbladder wall thickening, or biliary dilatation. Pancreas: Unremarkable. No pancreatic ductal dilatation or surrounding inflammatory changes. Spleen: Normal in size without focal abnormality. Adrenals/Urinary Tract: Adrenal glands are within normal limits. Left perinephric fat stranding. Heterogenous hypodensities within the left renal cortex. Mild urothelial enhancement on the left with slight prominence of left ureter. Bladder unremarkable. Stomach/Bowel: Stomach is within normal limits. Appendix appears normal. No evidence of bowel wall thickening, distention, or inflammatory changes. Vascular/Lymphatic: No significant vascular findings are present. No enlarged abdominal or pelvic lymph nodes. Reproductive: Uterus and bilateral adnexa are unremarkable. Other:  Negative for free air or free fluid. Musculoskeletal: Stable 7 mm anterolisthesis of L4 on L5. 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 IMPRESSION: 1. Left perinephric edema with heterogenous enhancement of left kidney and multifocal cortical hypodensity, collective findings are concerning for an acute pyelonephritis. Slight prominence of left ureter with urothelial enhancement, no definite stones seen along course of left ureter. 2. Negative appendix 3. Stable anterolisthesis of L4 on L5 with chronic appearing pars defects at L4 and L5 Electronically Signed   By: Jasmine Pang M.D.   On: 12/16/2016 02:18    Garnette Gunner, MD 12/20/2016, 10:46 AM PGY-1, [facility] FPTS Intern pager: [phone], text pages welcome

## 2016-12-20 NOTE — Progress Notes (Signed)
Progress Note  Patient Name: [name] Date of Encounter: 12/20/2016  Primary Cardiologist: Hilty  Subjective   No CP, no syncope, no SOB. Has some itching when having BM she states.    Inpatient Medications    Scheduled Meds: . amoxicillin  500 mg Oral Q8H  . magnesium gluconate  500 mg Oral Once  . ondansetron (ZOFRAN) IV  4 mg Intravenous Q8H  . pantoprazole  40 mg Oral Daily  . sodium chloride flush  3 mL Intravenous Q12H   Continuous Infusions: . sodium chloride 100 mL/hr at 12/20/16 0440   PRN Meds: acetaminophen, ibuprofen   Vital Signs    Vitals:   12/18/16 2244 12/19/16 0614 12/19/16 1426 12/19/16 2127  BP: 98/66 111/63 107/70 114/74  Pulse: 64 (!) 57 64 70  Resp: 18 17 18 18   Temp: 98.2 F (36.8 C) (!) 97.5 F (36.4 C) 98.4 F (36.9 C) 98.3 F (36.8 C)  TempSrc: Oral Oral Oral Oral  SpO2: 99% 100% 98% 98%  Weight:      Height:        Intake/Output Summary (Last 24 hours) at 12/20/16 0856 Last data filed at 12/19/16 1300  Gross per 24 hour  Intake              600 ml  Output              300 ml  Net              300 ml   Filed Weights   12/15/16 1933  Weight: 125 lb (56.7 kg)    Telemetry    No adverse rhythms - Personally Reviewed  ECG    TWI V2-3, NSR - Personally Reviewed  Physical Exam   GEN: Well nourished, well developed, in no acute distress  HEENT: normal  Neck: no JVD, carotid bruits, or masses Cardiac: RRR; no murmurs, rubs, or gallops,no edema  Respiratory:  clear to auscultation bilaterally, normal work of breathing GI: soft, nontender, nondistended, + BS MS: no deformity or atrophy  Skin: warm and dry, no rash Neuro:  Alert and Oriented x 3, Strength and sensation are intact Psych: euthymic mood, full affect   Labs    Chemistry Recent Labs Lab 12/15/16 1942  12/18/16 0607 12/19/16 0416 12/20/16 0641  NA 133*  < > 137 140 138  K 2.9*  < > 3.7 3.5 3.6  CL 100*  < > 107 108 104  CO2 21*  < > 20* 23 24    GLUCOSE 99  < > 98 91 93  BUN 10  < > <5* 6 7  CREATININE 0.77  < > 0.74 0.68 0.65  CALCIUM 8.6*  < > 8.5* 8.5* 8.6*  PROT 7.2  --   --   --   --   ALBUMIN 3.5  --   --   --   --   AST 22  --   --   --   --   ALT 14  --   --   --   --   ALKPHOS 66  --   --   --   --   BILITOT 1.1  --   --   --   --   GFRNONAA >60  < > >60 >60 >60  GFRAA >60  < > >60 >60 >60  ANIONGAP 12  < > 10 9 10   < > = values in this interval not displayed.   Hematology  Recent Labs Lab 12/18/16 0607 12/19/16 0416 12/20/16 0641  WBC 8.0 6.1 8.7  RBC 4.60 4.73 4.88  HGB 10.7* 11.1* 11.5*  HCT 33.3* 34.9* 35.6*  MCV 72.4* 73.8* 73.0*  MCH 23.3* 23.5* 23.6*  MCHC 32.1 31.8 32.3  RDW 15.0 15.5 15.1  PLT 151 194 230    Cardiac Enzymes  Recent Labs Lab 12/16/16 0830 12/16/16 1514 12/16/16 2117 12/17/16 0128  TROPONINI 0.04* 0.07* 0.04* 0.03*   No results for input(s): TROPIPOC in the last 168 hours.   BNPNo results for input(s): BNP, PROBNP in the last 168 hours.   DDimer   Recent Labs Lab 12/16/16 0411  DDIMER 1.86*     Radiology    No results found.  Cardiac Studies   ECHO - normal EF  Patient Profile     36 y.o. female with abnormal ECG (TWI V3), Troponin low level increase.  Assessment & Plan    Troponin elevation/abnormal ECG/pyelonephritis  - unclear etiology, no angina. Does not appear to be ACS related.   - likely a degree of demand ischemia in setting of fever 102.   - No coronary calcification  - old ecg similar in 2013  - Thankfully, no evidence of Hypertrophic cardiomyopathy  - ECHO reassuring, normal EF.  No further evaluation from cardiac perspective. Will sign off. Please call if ?   Signed, Donato Schultz, MD  12/20/2016, 8:56 AM

## 2016-12-20 NOTE — Progress Notes (Signed)
[name] to be D/C'd Home per MD order.  Discussed with the patient and all questions fully answered.  VSS, Skin clean, dry and intact without evidence of skin break down, no evidence of skin tears noted. IV catheter discontinued intact. Site without signs and symptoms of complications. Dressing and pressure applied.  An After Visit Summary was printed and given to the patient. Patient received prescription.  Allergies as of 12/20/2016   No Known Allergies     Medication List    STOP taking these medications   cetirizine 10 MG tablet Commonly known as:  ZYRTEC   hydrocortisone 2.5 % rectal cream Commonly known as:  ANUSOL-HC   ibuprofen 200 MG tablet Commonly known as:  ADVIL,MOTRIN   Olopatadine HCl 0.2 % Soln Commonly known as:  PATADAY   omeprazole 20 MG capsule Commonly known as:  PRILOSEC   polyethylene glycol powder powder Commonly known as:  GLYCOLAX/MIRALAX     TAKE these medications   acetaminophen 325 MG tablet Commonly known as:  TYLENOL Take 650 mg by mouth every 6 (six) hours as needed for mild pain.   amoxicillin 500 MG capsule Commonly known as:  AMOXIL Take 1 capsule (500 mg total) by mouth every 8 (eight) hours.   pantoprazole 40 MG tablet Commonly known as:  PROTONIX Take 1 tablet (40 mg total) by mouth daily.   witch hazel-glycerin pad Commonly known as:  TUCKS Apply topically as needed for itching.     CCMD notified.  D/c education completed with patient/family including follow up instructions, medication list, d/c activities limitations if indicated, with other d/c instructions as indicated by MD - patient able to verbalize understanding, all questions fully answered.   Patient instructed to return to ED, call 911, or call MD for any changes in condition.   Patient escorted via WC, and D/C home via private auto.  [name] 12/20/2016 4:19 PM

## 2016-12-21 ENCOUNTER — Other Ambulatory Visit: Payer: Self-pay | Admitting: *Deleted

## 2016-12-21 ENCOUNTER — Encounter: Payer: Self-pay | Admitting: *Deleted

## 2016-12-21 MED FILL — AMOXICILLIN 500 MG CAPSULE: 500 | 5 days supply | Qty: 17 | Fill #0

## 2016-12-21 MED FILL — PANTOPRAZOLE SOD DR 40 MG T: 40 | 30 days supply | Qty: 30 | Fill #0

## 2016-12-21 NOTE — Patient Outreach (Addendum)
[facility]) Care Management  12/21/2016  [name] 08/20/1980 [id number]  Subjective: Telephone call to [facility] at [facility], states they do not have interpreters for patient's primary  language [name]). Telephone call to patient's home number, spoke with patient, and HIPAA verified.   Patient gave [facility] verbal authorization to speak with daughter [name]) regarding her healthcare needs as needed.   Spoke with patient's daughter [name]).  Discussed [facility] Care Management [facility] Transition of care follow up, with patient, with patient's daughter, both voiced understanding, and is in agreement to follow up.   Patient's daughter states patient is doing much better and  has a follow up appointment with primary MD on 12/25/16.   Daughter states she is the interpreter for her family and patient does understand some [place].  Daughter states patient  is accessing the following [facility] benefits: outpatient pharmacy, hospital indemnity (verbally given number for [employer] hospital indemnity, [phone]), states she will call to verify mother's benefit, assist with filing claim if appropriate), and has started the family medical leave act (FMLA) process.  Daughter is aware that patient will need to obtain copies of FMLA paperwork for her records and follow up if needed.  Daughter states patient does not have any education material, transition of care, care coordination, disease management, disease monitoring, transportation, community resource, or pharmacy needs at this time. Daughter states she is very appreciative of the follow up and is in agreement to receive [facility] Care Management information on patient's behalf.  Per patient's daughter okay to send information in  [place] and she will interpret for patient if needed.     Objective: Per chart review, patient hospitalized 12/15/16 - 12/20/16 for Pyelonephritis.     Assessment: Received [facility] Transition of care referral on  12/18/16.  Transition of care follow up completed, no care management needs, and will proceed with case closure.   Plan: RNCM will send patient successful outreach letter, [facility] pamphlet, and magnet. RNCM will send case closure due to follow up completed / no care management needs request to [name] at [facility] Care Management.   [name] H. Gardiner Barefoot, BSN, CCM [facility] Care Management [facility] Telephonic CM Phone: [phone] Fax: [phone]

## 2016-12-25 ENCOUNTER — Ambulatory Visit (INDEPENDENT_AMBULATORY_CARE_PROVIDER_SITE_OTHER): Payer: 59 | Admitting: Family Medicine

## 2016-12-25 ENCOUNTER — Encounter: Payer: Self-pay | Admitting: Family Medicine

## 2016-12-25 VITALS — BP 92/62 | HR 67 | Temp 97.9°F | Ht 62.0 in | Wt 132.4 lb

## 2016-12-25 DIAGNOSIS — D649 Anemia, unspecified: Secondary | ICD-10-CM | POA: Diagnosis not present

## 2016-12-25 DIAGNOSIS — E876 Hypokalemia: Secondary | ICD-10-CM | POA: Diagnosis not present

## 2016-12-25 DIAGNOSIS — L298 Other pruritus: Secondary | ICD-10-CM

## 2016-12-25 DIAGNOSIS — N898 Other specified noninflammatory disorders of vagina: Secondary | ICD-10-CM

## 2016-12-25 LAB — POCT WET PREP (WET MOUNT)
CLUE CELLS WET PREP WHIFF POC: NEGATIVE
TRICHOMONAS WET PREP HPF POC: ABSENT

## 2016-12-25 MED ORDER — FLUCONAZOLE 150 MG PO TABS: 150.0000 mg | ORAL_TABLET | Freq: Once | ORAL | 0 refills | Status: AC

## 2016-12-25 NOTE — Progress Notes (Signed)
    Subjective:  [name] is a 36 y.o. female who presents to the [facility] today for hospital follow up.   HPI:  [name] is a 36yo F presenting today after being admitted for pyelonephritis 2/2 E. Coli and was discharged with course of amoxicillin. Also had hypokalemia and mild anemia during admission in the setting of positive FOBT. Denies SOB, CP, LE swelling, no back pain, dysuria, hematuria, hematochezia or melena, but does have some vaginal and perianal itching.   Tobacco use reviewed Medication: reviewed and updated ROS: see HPI   Objective:  Physical Exam: BP 92/62   Pulse 67   Temp 97.9 F (36.6 C) (Oral)   Ht 5\' 2"  (1.575 m)   Wt 132 lb 6.4 oz (60.1 kg)   SpO2 96%   BMI 24.22 kg/m   Gen: 36yo F in NAD, resting comfortably CV: RRR with no murmurs appreciated Pulm: NWOB, CTAB with no crackles, wheezes, or rhonchi GI: Normal bowel sounds present. Soft, Nontender, Nondistended. GU: normal appearing female genitalia, no lesions, discharge or blood noted in vaginal vault, normal appearing cervix without ectropion MSK: no edema, cyanosis, or clubbing noted Skin: warm, dry Neuro: grossly normal, moves all extremities Psych: Normal affect and thought content  No results found for this or any previous visit (from the past 72 hour(s)).   Assessment/Plan:  Hospital follow up  Finishing up course of amoxicillin and reports no further symptoms. Will check BMP to evaluate for persistent hypokalemia. Slight anemia during hospitalization with possible FOBT. -check CBC - continue abx - f/u BMP - return precautions discussed  Vaginal itching Normal appearing external genitalia without lesions. No apparent discharge and normal wet prep. Will continue to monitor. Given recent IV abx and current course of PO abx will empirically treat for yeast infection with diflucan.  - could consider course of steroid if symptom persist

## 2016-12-25 NOTE — Patient Instructions (Addendum)
[name], you were seen today for a hospital follow-up.  I'm glad to you that you're not having anymore kidney pain.  You had a low potassium and hemoglobin while in the hospital, so I will be checking both of these to make sure that they are stable.  Also I am checking to see if he may have an infection causing your vaginal itching.  I will contact you with these results and prescribe any medications that you may need.  Please continue your course of antibiotics.   Please come back in 3 months for a physical exam and pap smear.   Very nice seeing you today, [name] L. Myrtie Soman, MD [facility] Family Medicine Resident PGY-2 12/25/2016 11:04 AM

## 2016-12-26 LAB — BASIC METABOLIC PANEL
BUN / CREAT RATIO: 18 (ref 9–23)
BUN: 12 mg/dL (ref 6–20)
CO2: 24 mmol/L (ref 20–29)
CREATININE: 0.67 mg/dL (ref 0.57–1.00)
Calcium: 9.4 mg/dL (ref 8.7–10.2)
Chloride: 100 mmol/L (ref 96–106)
GFR, EST AFRICAN AMERICAN: 129 mL/min/{1.73_m2} (ref >59)
GFR, EST NON AFRICAN AMERICAN: 112 mL/min/{1.73_m2} (ref >59)
Glucose: 78 mg/dL (ref 65–99)
Potassium: 4 mmol/L (ref 3.5–5.2)
SODIUM: 140 mmol/L (ref 134–144)

## 2016-12-26 LAB — CBC
Hematocrit: 39.2 % (ref 34.0–46.6)
Hemoglobin: 12.1 g/dL (ref 11.1–15.9)
MCH: 23.3 pg — ABNORMAL LOW (ref 26.6–33.0)
MCHC: 30.9 g/dL — ABNORMAL LOW (ref 31.5–35.7)
MCV: 76 fL — AB (ref 79–97)
Platelets: 418 10*3/uL — ABNORMAL HIGH (ref 150–379)
RBC: 5.19 x10E6/uL (ref 3.77–5.28)
RDW: 15 % (ref 12.3–15.4)
WBC: 11.2 10*3/uL — ABNORMAL HIGH (ref 3.4–10.8)

## 2016-12-28 ENCOUNTER — Encounter: Payer: Self-pay | Admitting: Family Medicine

## 2016-12-29 ENCOUNTER — Encounter: Payer: Self-pay | Admitting: *Deleted

## 2016-12-29 ENCOUNTER — Telehealth: Payer: Self-pay | Admitting: Family Medicine

## 2016-12-29 NOTE — Telephone Encounter (Signed)
Patient came to office request a note to be able to return to work. Please let patient know. [phone].

## 2016-12-29 NOTE — Telephone Encounter (Signed)
Pt came in to front office and still not feeling well asking for a note to be out of work today.  I paged and text paged Dr. Myrtie Soman and did not here back from him so I went to Dr. McDiarmid preceptor for the day and he said it would be ok to give her a note for today. Note created and given to pt. Lamonte Sakai, Hazel Leveille D, [place]

## 2016-12-29 NOTE — Telephone Encounter (Signed)
Routing to PCP as an FYI. Zimmerman Rumple, April D, CMA  

## 2016-12-29 NOTE — Telephone Encounter (Signed)
Correction to prior note. Patient needs note for not being able to work her shift today.

## 2016-12-30 ENCOUNTER — Telehealth: Payer: Self-pay | Admitting: *Deleted

## 2016-12-30 ENCOUNTER — Encounter: Payer: Self-pay | Admitting: *Deleted

## 2016-12-30 NOTE — Telephone Encounter (Signed)
Pt came in today with her supervisor needing a letter to return to work.  I tried to page Dr. Myrtie SomanWarden and did not hear back from him and also looked for Dr. McDiarmid who gave permission to give her a note for 828/18 and he had already gone for the day.  Spoke with Dr. Jennette KettleNeal (preceptor) and she asked me to confirm with pt that she was prepared to return to work and was feeling better and she said she was so I gave her a letter stating she could return to work today. Routing to PCP as an Financial plannerYI. Lamonte SakaiZimmerman Rumple, April D, [place]

## 2017-02-19 ENCOUNTER — Encounter: Payer: Self-pay | Admitting: Family Medicine

## 2017-02-19 ENCOUNTER — Other Ambulatory Visit (HOSPITAL_COMMUNITY)
Admission: RE | Admit: 2017-02-19 | Discharge: 2017-02-19 | Disposition: A | Discharge status: Home / Self Care | Payer: 59 | Source: Ambulatory Visit | Attending: Family Medicine | Admitting: Family Medicine

## 2017-02-19 ENCOUNTER — Ambulatory Visit (INDEPENDENT_AMBULATORY_CARE_PROVIDER_SITE_OTHER): Payer: 59 | Admitting: Family Medicine

## 2017-02-19 VITALS — BP 108/76 | HR 60 | Temp 98.0°F | Ht 62.0 in | Wt 132.0 lb

## 2017-02-19 DIAGNOSIS — R109 Unspecified abdominal pain: Secondary | ICD-10-CM

## 2017-02-19 DIAGNOSIS — Z124 Encounter for screening for malignant neoplasm of cervix: Secondary | ICD-10-CM

## 2017-02-19 LAB — POCT URINALYSIS DIP (MANUAL ENTRY)
Bilirubin, UA: NEGATIVE
GLUCOSE UA: NEGATIVE mg/dL
Ketones, POC UA: NEGATIVE mg/dL
Leukocytes, UA: NEGATIVE
NITRITE UA: NEGATIVE
Protein Ur, POC: NEGATIVE mg/dL
Spec Grav, UA: 1.02 (ref 1.010–1.025)
UROBILINOGEN UA: 0.2 U/dL
pH, UA: 7 (ref 5.0–8.0)

## 2017-02-19 NOTE — Progress Notes (Signed)
    Subjective:  [name] is a 36 y.o. female who [facility] to follow up from an episode of pyelonephritis in August.     Patient's daughter Billey CoHleah provided interpretation. Waiver signed and in chart.   HPI:  Had dysuria in June that was presumed that was not presumed to be a UTI and then presented in August with pyelonephritis and required hospitalization and IV abx. She presents today with "coldness on her back" in the area of the left kidney. She reports that she felt this way during the infection. No burning with urination, fevers, chills, nausea, vomiting, diarrhea, vaginal discharge or vaginal bleeding.    PMH: pyelonephritis  Tobacco use: none Medication: reviewed and updated ROS: see HPI   Objective:  Physical Exam: BP 108/76   Pulse 60   Temp 98 F (36.7 C) (Oral)   Ht 5\' 2"  (1.575 m)   Wt 132 lb (59.9 kg)   SpO2 99%   BMI 24.14 kg/m   Gen: 36yo F in NAD, resting comfortably CV: RRR with no murmurs appreciated Pulm: NWOB, CTAB with no crackles, wheezes, or rhonchi GI: Normal bowel sounds present. Soft, Nontender, Nondistended. MSK: no edema, cyanosis, or clubbing noted Skin: warm, dry GU: normal appearing external female genitalia. Normal appearing cervix with slightly opened external os with scant blood, no cervical ectropion. No cervical motion tenderness or adenexa palpable.  Neuro: grossly normal, moves all extremities Psych: Normal affect and thought content  Results for orders placed or performed in visit on 02/19/17 (from the past 72 hour(s))  POCT urinalysis dipstick     Status: Abnormal   Collection Time: 02/19/17  9:25 AM  Result Value Ref Range   Color, UA yellow yellow   Clarity, UA clear clear   Glucose, UA negative negative mg/dL   Bilirubin, UA negative negative   Ketones, POC UA negative negative mg/dL   Spec Grav, UA 1.6101.020 [id number] - 1.025   Blood, UA trace-intact (A) negative   pH, UA 7.0 5.0 - 8.0   Protein Ur, POC  negative negative mg/dL   Urobilinogen, UA 0.2 0.2 or 1.0 E.U./dL   Nitrite, UA Negative Negative   Leukocytes, UA Negative Negative     Assessment/Plan:  L flank discomfort Patient with a history of pyelonephritis on the left side 2 months ago now coming in with vague discomfort in the region of the L kidney described as feeling "cold". No symptoms. UA normal. Very unlikely to be reinfection. Discussed return precautions.   Healthcare maintenance Due for PAP, otherwise UTD. Pap performed today and will follow up results with patient when available.   Lehua Flores L. Myrtie SomanWarden, MD [facility] Resident PGY-2 02/19/2017 12:18 PM

## 2017-02-19 NOTE — Patient Instructions (Signed)
[name], you were seen today to follow up and make sure you do not have a kidney infection. I have very low suspicion that you do. However, I want to reassure you and check your urine to make sure it looks ok.  We also did a pap smear and I will follow up with you when I get these results.  If you develop back pain, fevers, chills and burning with urination please come back and see me.   It was very nice to see you today, [name]. Myrtie SomanWarden, MD [facility] Resident PGY-2 02/19/2017 9:24 AM

## 2017-02-24 LAB — CYTOLOGY - PAP
Diagnosis: NEGATIVE
HPV: NOT DETECTED

## 2017-11-30 ENCOUNTER — Ambulatory Visit (INDEPENDENT_AMBULATORY_CARE_PROVIDER_SITE_OTHER): Payer: 59 | Admitting: Family Medicine

## 2017-11-30 ENCOUNTER — Other Ambulatory Visit: Payer: Self-pay

## 2017-11-30 DIAGNOSIS — K047 Periapical abscess without sinus: Secondary | ICD-10-CM

## 2017-11-30 MED ORDER — AMOXICILLIN 250 MG PO CAPS: 250.0000 mg | ORAL_CAPSULE | Freq: Three times a day (TID) | ORAL | 0 refills | Status: DC

## 2017-11-30 MED FILL — AMOXICILLIN 250 MG CAPSULE: 250 | 7 days supply | Qty: 21 | Fill #0

## 2017-11-30 NOTE — Patient Instructions (Signed)
You were seen in clinic today for chills and a mouth sore.  Although it was not possible to drain anything from the pocket of fluid in your mouth, it is possible that there is an infection.  I am prescribing you a course of antibiotics and have sent this to your pharmacy.  Please take this 3 times a day for the next 7 days even if you start to feel better.  You can take Tylenol as needed for pain and fever.  Call clinic if you have any questions.  Be well, Freddrick MarchYashika Reise Gladney MD

## 2017-11-30 NOTE — Progress Notes (Signed)
   Subjective:   Patient ID: [name]    DOB: 09/29/1980, 37 y.o. female   MRN: [id number]  CC: feeling cold, mouth sores  HPI: [name] is a 37 y.o. female who presents to clinic today for the following issues.  Sore in mouth Painful sore in right side of her mouth which started last Saturday.  Has not noticed a fever, but has also not been checking her temperatures at home.  Endorses chills.  No nausea or vomiting. Associated with mild headache and feeling tired.  Has had her flu shot.  No cough or congestion.  No runny nose, sore throat or itchy eyes.  Daughter recently had a cold last week. Has been eating and drinking normally.  No abdominal pain or diarrhea.  She has not taken any medications prior to coming in.   ROS: See HPI for pertinent ROS.  Social: Patient is a never smoker. Medications reviewed. Objective:   BP 98/68   Pulse 67   Temp 99.1 F (37.3 C) (Oral)   Ht 5\' 2"  (1.575 m)   Wt 139 lb 3.2 oz (63.1 kg)   SpO2 98%   BMI 25.46 kg/m  Vitals and nursing note reviewed.  General: Well-appearing 37 year old female, NAD HEENT: NCAT, EOMI, PERRL, MMM, 0.5 cm fluctuant lump in right mandible, no surrounding erythema, no open lesions or bleeding.  No gum involvement. Neck: supple, non-tender, normal ROM, no LAD  CV: RRR no MRG  Lungs: CTAB, normal effort   Assessment & Plan:   Dental infection Symptoms likely consistent with dental infection, patient describes a sore/lump in her right mandible which has been present for the past week.  She is unsure if she has been having fevers but endorses chills.  The area is fluctuant without drainage.  Discussed with patient that we are unable to treat this at this time given the deeper location between bucca mucosa teeth.  We will proceed with a course of antibiotics and I have recommended that she follow-up with a dentist if no improvement.  She is agreeable to this. -Rx: Amoxicillin -Tylenol prn -Return precautions  discussed  Meds ordered this encounter  Medications  . amoxicillin (AMOXIL) 250 MG capsule    Sig: Take 1 capsule (250 mg total) by mouth 3 (three) times daily.    Dispense:  21 capsule    Refill:  0    Freddrick MarchYashika Jaiyanna Safran, MD [facility], PGY-3 12/06/2017 9:38 AM

## 2017-12-06 DIAGNOSIS — K047 Periapical abscess without sinus: Secondary | ICD-10-CM | POA: Insufficient documentation

## 2017-12-06 NOTE — Assessment & Plan Note (Addendum)
Symptoms likely consistent with dental infection, patient describes a sore/lump in her right mandible which has been present for the past week.  She is unsure if she has been having fevers but endorses chills.  The area is fluctuant without drainage.  Discussed with patient that we are unable to treat this at this time given the deeper location between bucca mucosa teeth.  We will proceed with a course of antibiotics and I have recommended that she follow-up with a dentist if no improvement.  She is agreeable to this. -Rx: Amoxicillin -Tylenol prn -Return precautions discussed

## 2017-12-13 ENCOUNTER — Encounter: Payer: Self-pay | Admitting: Family Medicine

## 2017-12-13 ENCOUNTER — Other Ambulatory Visit: Payer: Self-pay

## 2017-12-13 ENCOUNTER — Ambulatory Visit (INDEPENDENT_AMBULATORY_CARE_PROVIDER_SITE_OTHER): Payer: 59 | Admitting: Family Medicine

## 2017-12-13 VITALS — BP 102/70 | HR 57 | Temp 97.7°F | Ht 62.0 in | Wt 138.2 lb

## 2017-12-13 DIAGNOSIS — R300 Dysuria: Secondary | ICD-10-CM | POA: Insufficient documentation

## 2017-12-13 LAB — POCT URINALYSIS DIP (MANUAL ENTRY)
BILIRUBIN UA: NEGATIVE
Glucose, UA: NEGATIVE mg/dL
Ketones, POC UA: NEGATIVE mg/dL
NITRITE UA: NEGATIVE
PH UA: 6.5 (ref 5.0–8.0)
Protein Ur, POC: NEGATIVE mg/dL
Spec Grav, UA: 1.015 (ref 1.010–1.025)
Urobilinogen, UA: 0.2 E.U./dL

## 2017-12-13 LAB — POCT UA - MICROSCOPIC ONLY

## 2017-12-13 MED ORDER — NITROFURANTOIN MONOHYD MACRO 100 MG PO CAPS: 100.0000 mg | ORAL_CAPSULE | Freq: Two times a day (BID) | ORAL | 0 refills | Status: DC

## 2017-12-13 MED FILL — NITROFURANTOIN MONO-MCR 100: 100 | 5 days supply | Qty: 10 | Fill #0

## 2017-12-13 NOTE — Patient Instructions (Addendum)
It was great to meet you today! Thank you for letting me participate in your care!  Today, we discussed your symptoms of painful urination and given the results of your urine sample you have a urinary tract infection. I am giving you an antibiotic called Nitrofurantoin to take twice a day for 5 days. Please ensure you take the full course.   If you develop a fever, back pain, nausea, vomiting, or if your symptoms do not get better after finishing the antibiotic please return to the clinic immediately.  Be well, Jules Schickim Kyiesha Millward, DO PGY-2, [facility]

## 2017-12-13 NOTE — Progress Notes (Signed)
Subjective: Chief Complaint  Patient presents with  . Dysuria     HPI: Thedore MinsHanhui Diekmann is a 37 y.o. presenting to clinic today to discuss the following:  Dysuria Patient states she has been having one week of painful urination. She denies any abnormal vaginal discharge, itching, or blood in her urine. She is not going more frequently. She denies any fever, chills, flank pain, or nausea or vomiting. She states she has had this in the past and has had multiple UTIs in the past.   Health Maintenance: None     ROS noted in HPI.   Past Medical, Surgical, Social, and Family History Reviewed & Updated per EMR.   Pertinent Historical Findings include:   Social History   Tobacco Use  Smoking Status Never Smoker  Smokeless Tobacco Never Used    Objective: BP 102/70   Pulse (!) 57   Temp 97.7 F (36.5 C) (Oral)   Ht 5\' 2"  (1.575 m)   Wt 138 lb 3.2 oz (62.7 kg)   SpO2 99%   BMI 25.28 kg/m  Vitals and nursing notes reviewed  Physical Exam Gen: NAD CV: RRR, no murmurs, normal S1, S2 split Resp: CTAB, no wheezing, rales, or rhonchi, comfortable work of breathing Abd: non-distended, non-tender, soft, +bs in all four quadrants GU: No flank pain bilaterally Ext: no clubbing, cyanosis, or edema Skin: warm, dry, intact, no rashes  Results for orders placed or performed in visit on 12/13/17 (from the past 72 hour(s))  POCT urinalysis dipstick     Status: Abnormal   Collection Time: 12/13/17  2:14 PM  Result Value Ref Range   Color, UA yellow yellow   Clarity, UA cloudy (A) clear   Glucose, UA negative negative mg/dL   Bilirubin, UA negative negative   Ketones, POC UA negative negative mg/dL   Spec Grav, UA 1.6101.015 9.6041.010 - 1.025   Blood, UA trace-intact (A) negative   pH, UA 6.5 5.0 - 8.0   Protein Ur, POC negative negative mg/dL   Urobilinogen, UA 0.2 0.2 or 1.0 E.U./dL   Nitrite, UA Negative Negative   Leukocytes, UA Large (3+) (A) Negative  POCT UA - Microscopic Only      Status: Abnormal   Collection Time: 12/13/17  2:14 PM  Result Value Ref Range   WBC, Ur, HPF, POC 10-20    RBC, urine, microscopic 1-5    Bacteria, U Microscopic TRACE    Epithelial cells, urine per micros 5-10     Assessment/Plan:  Dysuria UTI is most likely cause of her dysuria given U/A was positive for +3 leuks and trace blood with trace bacteria and WBC 10-15 range. Also, patient has history of UTI that was consistent with her presenting symptoms.   Macrobid 100mg  BID for 5 days. Patient knows if she does not improve, develops fever, chills, and/or flank pain to return to clinic to seek reevaluation.   PATIENT EDUCATION PROVIDED: See AVS    Diagnosis and plan along with any newly prescribed medication(s) were discussed in detail with this patient today. The patient verbalized understanding and agreed with the plan. Patient advised if symptoms worsen return to clinic or ER.   Health Maintainance:   Orders Placed This Encounter  Procedures  . POCT urinalysis dipstick  . POCT UA - Microscopic Only    Meds ordered this encounter  Medications  . nitrofurantoin, macrocrystal-monohydrate, (MACROBID) 100 MG capsule    Sig: Take 1 capsule (100 mg total) by mouth 2 (two)  times daily.    Dispense:  10 capsule    Refill:  0     Jules Schickim Iosefa Weintraub, DO 12/13/2017, 2:43 PM PGY-2 [facility]

## 2017-12-15 LAB — URINE CULTURE

## 2017-12-18 NOTE — Assessment & Plan Note (Signed)
UTI is most likely cause of her dysuria given U/A was positive for +3 leuks and trace blood with trace bacteria and WBC 10-15 range. Also, patient has history of UTI that was consistent with her presenting symptoms.   Macrobid 100mg  BID for 5 days. Patient knows if she does not improve, develops fever, chills, and/or flank pain to return to clinic to seek reevaluation.

## 2018-06-08 ENCOUNTER — Ambulatory Visit (HOSPITAL_COMMUNITY)
Admission: EM | Admit: 2018-06-08 | Discharge: 2018-06-08 | Disposition: A | Discharge status: Home / Self Care | Payer: 59 | Attending: Emergency Medicine | Admitting: Emergency Medicine

## 2018-06-08 ENCOUNTER — Encounter (HOSPITAL_COMMUNITY): Payer: Self-pay | Admitting: Emergency Medicine

## 2018-06-08 ENCOUNTER — Ambulatory Visit (INDEPENDENT_AMBULATORY_CARE_PROVIDER_SITE_OTHER): Payer: 59

## 2018-06-08 DIAGNOSIS — R69 Illness, unspecified: Secondary | ICD-10-CM

## 2018-06-08 DIAGNOSIS — J111 Influenza due to unidentified influenza virus with other respiratory manifestations: Secondary | ICD-10-CM

## 2018-06-08 DIAGNOSIS — R05 Cough: Secondary | ICD-10-CM | POA: Diagnosis not present

## 2018-06-08 DIAGNOSIS — R51 Headache: Secondary | ICD-10-CM | POA: Diagnosis not present

## 2018-06-08 DIAGNOSIS — N390 Urinary tract infection, site not specified: Secondary | ICD-10-CM | POA: Insufficient documentation

## 2018-06-08 DIAGNOSIS — R0602 Shortness of breath: Secondary | ICD-10-CM | POA: Diagnosis not present

## 2018-06-08 DIAGNOSIS — R062 Wheezing: Secondary | ICD-10-CM | POA: Diagnosis not present

## 2018-06-08 DIAGNOSIS — R110 Nausea: Secondary | ICD-10-CM | POA: Diagnosis not present

## 2018-06-08 LAB — POCT URINALYSIS DIP (DEVICE)
Bilirubin Urine: NEGATIVE
Glucose, UA: NEGATIVE mg/dL
Hgb urine dipstick: NEGATIVE
Ketones, ur: NEGATIVE mg/dL
Nitrite: NEGATIVE
PH: 7 (ref 5.0–8.0)
PROTEIN: 30 mg/dL — AB
SPECIFIC GRAVITY, URINE: 1.025 (ref 1.005–1.030)
Urobilinogen, UA: 1 mg/dL (ref 0.0–1.0)

## 2018-06-08 MED ORDER — FLUTICASONE PROPIONATE 50 MCG/ACT NA SUSP: 2.0000 | Freq: Every day | NASAL | 0 refills | Status: DC

## 2018-06-08 MED ORDER — ALBUTEROL SULFATE HFA 108 (90 BASE) MCG/ACT IN AERS: 1.0000 | INHALATION_SPRAY | Freq: Four times a day (QID) | RESPIRATORY_TRACT | 0 refills | Status: DC | PRN

## 2018-06-08 MED ORDER — AEROCHAMBER PLUS MISC: 2 refills | Status: DC

## 2018-06-08 MED ORDER — CEPHALEXIN 500 MG PO CAPS: 500.0000 mg | ORAL_CAPSULE | Freq: Two times a day (BID) | ORAL | 0 refills | Status: DC

## 2018-06-08 MED ORDER — IBUPROFEN 600 MG PO TABS: 600.0000 mg | ORAL_TABLET | Freq: Four times a day (QID) | ORAL | 0 refills | Status: DC | PRN

## 2018-06-08 MED ORDER — BENZONATATE 200 MG PO CAPS: 200.0000 mg | ORAL_CAPSULE | Freq: Three times a day (TID) | ORAL | 0 refills | Status: DC | PRN

## 2018-06-08 MED FILL — CEPHALEXIN 500 MG CAPSULE: 500 | 7 days supply | Qty: 14 | Fill #0

## 2018-06-08 MED FILL — BENZONATATE 200 MG CAPS: 200 | 10 days supply | Qty: 30 | Fill #0

## 2018-06-08 MED FILL — IBUPROFEN 600 MG TABLET: 600 | 7 days supply | Qty: 30 | Fill #0

## 2018-06-08 MED FILL — FLUTICASONE PROP 50 MCG SPR: 50 | 30 days supply | Qty: 16 | Fill #0

## 2018-06-08 MED FILL — VENTOLIN HFA 90 MCG INHALER: 108 (90 BAS | 25 days supply | Qty: 18 | Fill #0

## 2018-06-08 NOTE — Discharge Instructions (Addendum)
I am sending your urine off for culture to make sure that you have a UTI and to make sure that you are on the right antibiotics.  Make sure you drink plenty of extra fluids.  Flonase, Mucinex, saline nasal irrigation with a Lloyd Huger med rinse and distilled water as often as she wants, ibuprofen 600 mg combined with 1 g of Tylenol together 3 or 4 times a day as needed, Tessalon, 2 puffs from her albuterol inhaler every 4-6 hours using her spacer as needed for coughing, wheezing, shortness of breath.

## 2018-06-08 NOTE — ED Notes (Signed)
Patient able to ambulate independently  

## 2018-06-08 NOTE — ED Provider Notes (Signed)
HPI  SUBJECTIVE:  [name] is a 38 y.o. female who presents with headaches, sore throat, nonproductive cough, wheezing, shortness of breath, dyspnea on exertion, nausea starting 3 days ago.  States that she feels "always cold".  She denies fevers, nasal congestion, rhinorrhea, sinus pain or pressure, body aches.  She reports left upper back pain that is worse when she rotates her head to the left and with neck flexion.  No postnasal drip, chest pain, vomiting, diarrhea, neck stiffness, abdominal pain.  She got a flu shot this year.  No contacts with flu.  No antibiotics in the past month.  No antipyretic in the past 4 to 6 hours.  She is sleeping through the night without waking up coughing.  She has tried traditional medicine, Tylenol with mild improvement in her symptoms.  No aggravating factors.  She also reports non-migratory, nonradiating very mild right upper quadrant and right flank pain intermittent, lasting 2 to 3 minutes and urinary frequency during this time.  It is not getting any worse.  She denies dysuria, urgency, cloudy odorous urine, hematuria.  States that this is similar pain to when she had pyelonephritis resulting in sepsis.  She also has a past medical history of GERD, gastric ulcer.  No history of pulmonary disease, smoking, pneumonia, diabetes, hypertension, gallbladder disease, pancreatitis.  LMP: Amenorrheic.  Status post bilateral lateral tubal ligation.  PMD: [facility].  All history obtained from family member who is acting as Nurse, learning disabilitytranslator.    Past Medical History:  Diagnosis Date  . GERD (gastroesophageal reflux disease)   . Hemorrhoids   . Pap smear for cervical cancer screening 09/02/2011    Past Surgical History:  Procedure Laterality Date  . BILATERAL SALPINGECTOMY Bilateral 02/13/2013   Procedure: BILATERAL SALPINGECTOMY;  Surgeon: Purcell NailsAngela Y Roberts, MD;  Location: [facility];  Service: Gynecology;  Laterality: Bilateral;  . CESAREAN SECTION    .  ESOPHAGOGASTRODUODENOSCOPY  09/29/2011   Procedure: ESOPHAGOGASTRODUODENOSCOPY (EGD);  Surgeon: Petra KubaMarc E Magod, MD;  Location: [facility];  Service: Endoscopy;  Laterality: N/A;  . LAPAROSCOPIC BILATERAL SALPINGECTOMY Bilateral 02/13/2013   Procedure: LAPAROSCOPIC BILATERAL SALPINGECTOMY VIA FULGERATION;  Surgeon: Purcell NailsAngela Y Roberts, MD;  Location: [facility];  Service: Gynecology;  Laterality: Bilateral;  Laparoscopic BTL via Fulguration/ possible Bilateral Salpingectomy    Family History  Problem Relation Age of Onset  . Healthy Mother     Social History   Tobacco Use  . Smoking status: Never Smoker  . Smokeless tobacco: Never Used  Substance Use Topics  . Alcohol use: No  . Drug use: No    No current facility-administered medications for this encounter.   Current Outpatient Medications:  .  albuterol (PROVENTIL HFA;VENTOLIN HFA) 108 (90 Base) MCG/ACT inhaler, Inhale 1-2 puffs into the lungs every 6 (six) hours as needed for wheezing or shortness of breath., Disp: 1 Inhaler, Rfl: 0 .  benzonatate (TESSALON) 200 MG capsule, Take 1 capsule (200 mg total) by mouth 3 (three) times daily as needed for cough., Disp: 30 capsule, Rfl: 0 .  cephALEXin (KEFLEX) 500 MG capsule, Take 1 capsule (500 mg total) by mouth 2 (two) times daily., Disp: 14 capsule, Rfl: 0 .  fluticasone (FLONASE) 50 MCG/ACT nasal spray, Place 2 sprays into both nostrils daily., Disp: 16 g, Rfl: 0 .  ibuprofen (ADVIL,MOTRIN) 600 MG tablet, Take 1 tablet (600 mg total) by mouth every 6 (six) hours as needed., Disp: 30 tablet, Rfl: 0 .  pantoprazole (PROTONIX) 40 MG tablet, Take 1 tablet (  40 mg total) by mouth daily., Disp: 30 tablet, Rfl: 0 .  Spacer/Aero-Holding Chambers (AEROCHAMBER PLUS) inhaler, Use as instructed, Disp: 1 each, Rfl: 2 .  witch hazel-glycerin (TUCKS) pad, Apply topically as needed for itching., Disp: 40 each, Rfl: 12  No Known Allergies   ROS  As noted in HPI.   Physical Exam  BP 112/80 (BP  Location: Right Arm)   Pulse 85   Temp 99.1 F (37.3 C) (Oral)   Resp 18   SpO2 97%   Constitutional: Well developed, well nourished, no acute distress Eyes: PERRL, EOMI, conjunctiva normal bilaterally HENT: Normocephalic, atraumatic,mucus membranes moist positive nasal congestion.  Questionable frontal sinus tenderness.  No maxillary sinus tenderness.  Normal tonsils.  Positive cobblestoning.  No obvious postnasal drip. Neck: Positive shotty cervical lymphadenopathy, no meningismus.  Positive tenderness along the left trapezius. Respiratory: Clear to auscultation bilaterally, no rales, no wheezing, no rhonchi Cardiovascular: Normal rate and rhythm, no murmurs, no gallops, no rubs GI: Soft, nondistended, normal bowel sounds, mild right upper quadrant and flank tenderness to deep palpation, no rebound, no guarding.  Negative Murphy, negative McBurney Back: no CVAT skin: No rash, skin intact Musculoskeletal: No edema, no tenderness, no deformities Neurologic: Alert & oriented x 3, CN II-XII grossly intact, no motor deficits, sensation grossly intact Psychiatric: Speech and behavior appropriate   ED Course   Medications - No data to display  Orders Placed This Encounter  Procedures  . Urine culture    Standing Status:   Standing    Number of Occurrences:   1  . DG Chest 2 View    Standing Status:   Standing    Number of Occurrences:   1    Order Specific Question:   Reason for Exam (SYMPTOM  OR DIAGNOSIS REQUIRED)    Answer:   cough r.o PNA  . POCT urinalysis dip (device)    Standing Status:   Standing    Number of Occurrences:   1   Results for orders placed or performed during the hospital encounter of 06/08/18 (from the past 24 hour(s))  POCT urinalysis dip (device)     Status: Abnormal   Collection Time: 06/08/18 11:04 AM  Result Value Ref Range   Glucose, UA NEGATIVE NEGATIVE mg/dL   Bilirubin Urine NEGATIVE NEGATIVE   Ketones, ur NEGATIVE NEGATIVE mg/dL   Specific  Gravity, Urine 1.025 1.005 - 1.030   Hgb urine dipstick NEGATIVE NEGATIVE   pH 7.0 5.0 - 8.0   Protein, ur 30 (A) NEGATIVE mg/dL   Urobilinogen, UA 1.0 0.0 - 1.0 mg/dL   Nitrite NEGATIVE NEGATIVE   Leukocytes, UA TRACE (A) NEGATIVE   Dg Chest 2 View  Result Date: 06/08/2018 CLINICAL DATA:  Patient's sick for 3 days. Cough and low-grade fever. EXAM: CHEST - 2 VIEW COMPARISON:  August 03, 2011 FINDINGS: No pneumothorax. The heart size is borderline to mildly enlarged. The hila and mediastinum are unchanged. No pulmonary nodules or masses. Minimal atelectasis in the bases. No suspicious infiltrates. IMPRESSION: Minimal opacity in the bases most consistent with atelectasis. No suspicious infiltrates. Electronically Signed   By: Gerome Sam III M.D   On: 06/08/2018 11:59    ED Clinical Impression  Influenza-like illness  Urinary tract infection without hematuria, site unspecified   ED Assessment/Plan   Checking a urine given history of sepsis and she states that this abdominal pain feels similar to when she had pyelo.  No evidence of surgical abdomen today.  The abdominal tenderness  could be from coughing.  Will also check a chest x-ray.  Urine trace leukocytes, suggestive of UTI.  Will send this off for culture to confirm absence or presence of UTI and to confirm antibiotic choice.  Reviewed imaging independently. Minimal Opacity in the bases consistent with atelectasis.  No suspicious infiltrates. See radiology report for full details.  1. Patient with an influenza-like illness/URI.  Plan to send home with Flonase, Mucinex, saline nasal irrigation with a Lloyd Huger med rinse and distilled water as often as she wants, ibuprofen 600 mg combined with 1 g of Tylenol together 3 or 4 times a day as needed, Tessalon, 2 puffs from her albuterol inhaler every 4-6 hours using her spacer as needed for coughing, wheezing, shortness of breath.   2. Keflex for presumed UTI.    Follow Up with PMD in  several days, gave patient and family member strict ER return precautions.  Discussed labs, imaging, MDM, treatment plan, and plan for follow-up with family Discussed sn/sx that should prompt return to the ED. family agrees with plan.   Meds ordered this encounter  Medications  . fluticasone (FLONASE) 50 MCG/ACT nasal spray    Sig: Place 2 sprays into both nostrils daily.    Dispense:  16 g    Refill:  0  . albuterol (PROVENTIL HFA;VENTOLIN HFA) 108 (90 Base) MCG/ACT inhaler    Sig: Inhale 1-2 puffs into the lungs every 6 (six) hours as needed for wheezing or shortness of breath.    Dispense:  1 Inhaler    Refill:  0  . Spacer/Aero-Holding Chambers (AEROCHAMBER PLUS) inhaler    Sig: Use as instructed    Dispense:  1 each    Refill:  2  . ibuprofen (ADVIL,MOTRIN) 600 MG tablet    Sig: Take 1 tablet (600 mg total) by mouth every 6 (six) hours as needed.    Dispense:  30 tablet    Refill:  0  . benzonatate (TESSALON) 200 MG capsule    Sig: Take 1 capsule (200 mg total) by mouth 3 (three) times daily as needed for cough.    Dispense:  30 capsule    Refill:  0  . cephALEXin (KEFLEX) 500 MG capsule    Sig: Take 1 capsule (500 mg total) by mouth 2 (two) times daily.    Dispense:  14 capsule    Refill:  0    *This clinic note was created using Scientist, clinical (histocompatibility and immunogenetics). Therefore, there may be occasional mistakes despite careful proofreading.  ?    Domenick Gong, MD 06/09/18 [phone]

## 2018-06-08 NOTE — ED Triage Notes (Signed)
Pt here for cough and body aches x 3 days  

## 2018-06-09 LAB — URINE CULTURE

## 2018-09-07 DIAGNOSIS — J3089 Other allergic rhinitis: Secondary | ICD-10-CM | POA: Diagnosis not present

## 2018-09-07 DIAGNOSIS — B9689 Other specified bacterial agents as the cause of diseases classified elsewhere: Secondary | ICD-10-CM | POA: Diagnosis not present

## 2018-09-07 DIAGNOSIS — J329 Chronic sinusitis, unspecified: Secondary | ICD-10-CM | POA: Diagnosis not present

## 2018-09-07 DIAGNOSIS — R12 Heartburn: Secondary | ICD-10-CM | POA: Diagnosis not present

## 2018-09-16 DIAGNOSIS — J329 Chronic sinusitis, unspecified: Secondary | ICD-10-CM | POA: Diagnosis not present

## 2018-09-16 DIAGNOSIS — Z9109 Other allergy status, other than to drugs and biological substances: Secondary | ICD-10-CM | POA: Diagnosis not present

## 2018-09-16 DIAGNOSIS — R12 Heartburn: Secondary | ICD-10-CM | POA: Diagnosis not present

## 2018-09-16 DIAGNOSIS — B9689 Other specified bacterial agents as the cause of diseases classified elsewhere: Secondary | ICD-10-CM | POA: Diagnosis not present

## 2019-03-19 DIAGNOSIS — R509 Fever, unspecified: Secondary | ICD-10-CM | POA: Diagnosis not present

## 2019-03-19 DIAGNOSIS — Z20828 Contact with and (suspected) exposure to other viral communicable diseases: Secondary | ICD-10-CM | POA: Diagnosis not present

## 2019-05-14 ENCOUNTER — Other Ambulatory Visit: Payer: Self-pay

## 2019-05-14 ENCOUNTER — Encounter (HOSPITAL_COMMUNITY): Payer: Self-pay | Admitting: Emergency Medicine

## 2019-05-14 ENCOUNTER — Emergency Department (HOSPITAL_COMMUNITY): Payer: 59

## 2019-05-14 ENCOUNTER — Emergency Department (HOSPITAL_COMMUNITY)
Admission: EM | Admit: 2019-05-14 | Discharge: 2019-05-14 | Disposition: A | Discharge status: Home / Self Care | Payer: 59 | Attending: Emergency Medicine | Admitting: Emergency Medicine

## 2019-05-14 DIAGNOSIS — U071 COVID-19: Secondary | ICD-10-CM | POA: Diagnosis not present

## 2019-05-14 DIAGNOSIS — R1031 Right lower quadrant pain: Secondary | ICD-10-CM

## 2019-05-14 DIAGNOSIS — R1110 Vomiting, unspecified: Secondary | ICD-10-CM | POA: Diagnosis not present

## 2019-05-14 DIAGNOSIS — Z79899 Other long term (current) drug therapy: Secondary | ICD-10-CM | POA: Insufficient documentation

## 2019-05-14 DIAGNOSIS — K37 Unspecified appendicitis: Secondary | ICD-10-CM | POA: Diagnosis not present

## 2019-05-14 DIAGNOSIS — I880 Nonspecific mesenteric lymphadenitis: Secondary | ICD-10-CM | POA: Insufficient documentation

## 2019-05-14 DIAGNOSIS — R109 Unspecified abdominal pain: Secondary | ICD-10-CM | POA: Diagnosis not present

## 2019-05-14 DIAGNOSIS — E876 Hypokalemia: Secondary | ICD-10-CM | POA: Insufficient documentation

## 2019-05-14 LAB — LIPASE, BLOOD: Lipase: 31 U/L (ref 11–51)

## 2019-05-14 LAB — URINALYSIS, ROUTINE W REFLEX MICROSCOPIC
Bacteria, UA: NONE SEEN
Bilirubin Urine: NEGATIVE
Glucose, UA: NEGATIVE mg/dL
Ketones, ur: NEGATIVE mg/dL
Leukocytes,Ua: NEGATIVE
Nitrite: NEGATIVE
Protein, ur: NEGATIVE mg/dL
Specific Gravity, Urine: 1.008 (ref 1.005–1.030)
pH: 8 (ref 5.0–8.0)

## 2019-05-14 LAB — COMPREHENSIVE METABOLIC PANEL
ALT: 20 U/L (ref 0–44)
AST: 23 U/L (ref 15–41)
Albumin: 3.6 g/dL (ref 3.5–5.0)
Alkaline Phosphatase: 70 U/L (ref 38–126)
Anion gap: 8 (ref 5–15)
BUN: 5 mg/dL — ABNORMAL LOW (ref 6–20)
CO2: 25 mmol/L (ref 22–32)
Calcium: 9.1 mg/dL (ref 8.9–10.3)
Chloride: 106 mmol/L (ref 98–111)
Creatinine, Ser: 0.77 mg/dL (ref 0.44–1.00)
GFR calc Af Amer: >60 mL/min (ref >60)
GFR calc non Af Amer: >60 mL/min (ref >60)
Glucose, Bld: 77 mg/dL (ref 70–99)
Potassium: 3.3 mmol/L — ABNORMAL LOW (ref 3.5–5.1)
Sodium: 139 mmol/L (ref 135–145)
Total Bilirubin: 0.6 mg/dL (ref 0.3–1.2)
Total Protein: 7.8 g/dL (ref 6.5–8.1)

## 2019-05-14 LAB — DIFFERENTIAL
Abs Immature Granulocytes: 0.04 10*3/uL (ref 0.00–0.07)
Basophils Absolute: 0 10*3/uL (ref 0.0–0.1)
Basophils Relative: 0 %
Eosinophils Absolute: 0.2 10*3/uL (ref 0.0–0.5)
Eosinophils Relative: 1 %
Immature Granulocytes: 0 %
Lymphocytes Relative: 18 %
Lymphs Abs: 2.1 10*3/uL (ref 0.7–4.0)
Monocytes Absolute: 1.2 10*3/uL — ABNORMAL HIGH (ref 0.1–1.0)
Monocytes Relative: 10 %
Neutro Abs: 8.5 10*3/uL — ABNORMAL HIGH (ref 1.7–7.7)
Neutrophils Relative %: 71 %

## 2019-05-14 LAB — CBC
HCT: 41.4 % (ref 36.0–46.0)
Hemoglobin: 12.7 g/dL (ref 12.0–15.0)
MCH: 23.7 pg — ABNORMAL LOW (ref 26.0–34.0)
MCHC: 30.7 g/dL (ref 30.0–36.0)
MCV: 77.2 fL — ABNORMAL LOW (ref 80.0–100.0)
Platelets: 192 10*3/uL (ref 150–400)
RBC: 5.36 MIL/uL — ABNORMAL HIGH (ref 3.87–5.11)
RDW: 15.1 % (ref 11.5–15.5)
WBC: 12 10*3/uL — ABNORMAL HIGH (ref 4.0–10.5)
nRBC: 0 % (ref 0.0–0.2)

## 2019-05-14 LAB — I-STAT BETA HCG BLOOD, ED (MC, WL, AP ONLY): I-stat hCG, quantitative: 11.2 m[IU]/mL — ABNORMAL HIGH (ref <5)

## 2019-05-14 LAB — PREGNANCY, URINE: Preg Test, Ur: NEGATIVE

## 2019-05-14 MED ORDER — ONDANSETRON HCL 4 MG/2ML IJ SOLN
4.0000 mg | Freq: Once | INTRAMUSCULAR | Status: AC
  Administered 2019-05-14: 13:00:00 4 mg via INTRAVENOUS
  Filled 2019-05-14: qty 2

## 2019-05-14 MED ORDER — HYDROCODONE-ACETAMINOPHEN 5-325 MG PO TABS: 1.0000 | ORAL_TABLET | Freq: Four times a day (QID) | ORAL | 0 refills | Status: DC | PRN

## 2019-05-14 MED ORDER — DICYCLOMINE HCL 20 MG PO TABS: 20.0000 mg | ORAL_TABLET | Freq: Two times a day (BID) | ORAL | 0 refills | Status: DC

## 2019-05-14 MED ORDER — IOHEXOL 300 MG/ML  SOLN
100.0000 mL | Freq: Once | INTRAMUSCULAR | Status: AC | PRN
  Administered 2019-05-14: 100 mL via INTRAVENOUS

## 2019-05-14 MED ORDER — SODIUM CHLORIDE 0.9 % IV BOLUS
1000.0000 mL | Freq: Once | INTRAVENOUS | Status: AC
  Administered 2019-05-14: 13:00:00 1000 mL via INTRAVENOUS

## 2019-05-14 MED ORDER — SODIUM CHLORIDE 0.9% FLUSH: 3.0000 mL | Freq: Once | INTRAVENOUS | Status: DC

## 2019-05-14 MED ORDER — POTASSIUM CHLORIDE CRYS ER 20 MEQ PO TBCR: 20.0000 meq | EXTENDED_RELEASE_TABLET | Freq: Every day | ORAL | 0 refills | Status: DC

## 2019-05-14 MED ORDER — ONDANSETRON 4 MG PO TBDP: 4.0000 mg | ORAL_TABLET | Freq: Three times a day (TID) | ORAL | 0 refills | Status: DC | PRN

## 2019-05-14 MED ORDER — MORPHINE SULFATE (PF) 4 MG/ML IV SOLN
4.0000 mg | Freq: Once | INTRAVENOUS | Status: AC
  Administered 2019-05-14: 4 mg via INTRAVENOUS
  Filled 2019-05-14: qty 1

## 2019-05-14 NOTE — ED Triage Notes (Addendum)
C/o RLQ pain since yesterday.  Denies nausea, vomiting, urinary complaints, or fever.  PCP sent pt to ED to R/o appendicitis.

## 2019-05-14 NOTE — Discharge Instructions (Signed)
The CT scan showed some inflammation in the lymph nodes of the covering to the bowel.  This is a rather nonspecific finding, however, in some cases it has been found in patients with COVID-19 infection, even without other symptoms.  Abdominal pain  Hand washing: Wash your hands throughout the day, but especially before and after touching the face, using the restroom, sneezing, coughing, or touching surfaces that have been coughed or sneezed upon. Hydration: Symptoms will be intensified and complicated by dehydration. Dehydration can also extend the duration of symptoms. Drink plenty of fluids and get plenty of rest. You should be drinking at least half a liter of water an hour to stay hydrated. Electrolyte drinks (ex. Gatorade, Powerade, Pedialyte) are also encouraged. You should be drinking enough fluids to make your urine light yellow, almost clear. If this is not the case, you are not drinking enough water. Please note that some of the treatments indicated below will not be effective if you are not adequately hydrated. Diet: Please concentrate on hydration, however, you may introduce food slowly.  Start with a clear liquid diet, progressed to a full liquid diet, and then bland solids as you are able. Pain or fever: Ibuprofen, Naproxen, or Tylenol for pain or fever.  Antiinflammatory medications: Take 600 mg of ibuprofen every 6 hours or 440 mg (over the counter dose) to 500 mg (prescription dose) of naproxen every 12 hours for the next 3 days. After this time, these medications may be used as needed for pain. Take these medications with food to avoid upset stomach. Choose only one of these medications, do not take them together. Acetaminophen (generic for Tylenol): Should you continue to have additional pain while taking the ibuprofen or naproxen, you may add in acetaminophen as needed. Your daily total maximum amount of acetaminophen from all sources should be limited to 4000mg /day for persons without  liver problems, or 2000mg /day for those with liver problems. Vicodin: May take Vicodin (hydrocodone-acetaminophen) as needed for severe pain.   Do not drive or perform other dangerous activities while taking this medication as it can cause drowsiness as well as changes in reaction time and judgement.   Please note that each pill of Vicodin contains 325 mg of acetaminophen (generic for Tylenol) and the above dosage limits apply. Nausea/vomiting: Use the ondansetron (generic for Zofran) for nausea or vomiting.  This medication may not prevent all vomiting or nausea, but can help facilitate better hydration. Things that can help with nausea/vomiting also include peppermint/menthol candies, vitamin B12, and ginger. Diarrhea: May use medications such as loperamide (Imodium) or Bismuth subsalicylate (Pepto-Bismol). Bentyl: This medication is what is known as an antispasmodic and is intended to help reduce abdominal discomfort. Follow-up: Follow-up with a primary care provider on this matter.  May also need to follow-up with a gastroenterologist should symptoms fail to resolve. Return: Return should you develop a fever, bloody diarrhea, increased abdominal pain, uncontrolled vomiting, or any other major concerns.  For prescription assistance, may try using prescription discount sites or apps, such as goodrx.com  Additionally, your potassium was slightly lower than normal.  This can be usually corrected through dietary changes.  This level should be rechecked and further managed by a primary care provider.

## 2019-05-14 NOTE — ED Notes (Signed)
PA Joy at bedside 

## 2019-05-14 NOTE — ED Notes (Signed)
Discharge instructions and prescriptions discussed with Pt. Pt verbalized understanding. Pt stable and ambulatory.   

## 2019-05-14 NOTE — ED Provider Notes (Signed)
[facility] EMERGENCY DEPARTMENT Provider Note   CSN: [id number] Arrival date & time: 05/14/19  1105     History Chief Complaint  Patient presents with  . Abdominal Pain    [name] is a 39 y.o. female.  No language interpreter was used (Interpreter was offered during the initial interview, but patient declined.).        [name] is a 39 y.o. female, with a history of GERD, presenting to the ED with abdominal pain beginning 3 days ago.  Pain is in the right lower quadrant, waxing and waning, currently severe, stabbing in nature, nonradiating.  She has not had similar symptoms in the past. Last food was around 7 AM this morning.  Last BM around 9:30 AM this morning and was normal. She states she stopped having a menstrual cycle following her tubal ligation. Denies fever/chills, N/V/D, urinary symptoms, flank/back pain, hematochezia/melena, abnormal vaginal discharge, vaginal bleeding, or any other complaints.  Past Medical History:  Diagnosis Date  . GERD (gastroesophageal reflux disease)   . Hemorrhoids   . Pap smear for cervical cancer screening 09/02/2011    Patient Active Problem List   Diagnosis Date Noted  . Dysuria 12/13/2017  . Dental infection 12/06/2017  . Bacteremia   . Abdominal pain   . Hypokalemia   . Epigastric pain   . Sepsis (HCC)   . Constipation 10/25/2014  . Hemorrhoids 10/25/2014  . Allergic conjunctivitis and rhinitis 09/05/2014  . 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 Family planning, BCP (birth control pills) maintenance 06/06/2012  . Birth control counseling 06/06/2012  . Pyelonephritis, acute 10/28/2011  . Pap smear for cervical cancer screening 09/02/2011  . Gastritis 08/07/2011  . Gastric ulcer 06/25/2011  . Reflux 04/15/2011  . Lumbago 03/20/2010    Past Surgical History:  Procedure Laterality Date  . BILATERAL SALPINGECTOMY Bilateral 02/13/2013   Procedure: BILATERAL SALPINGECTOMY;  Surgeon: Purcell Nails, MD;  Location: [facility];  Service: Gynecology;  Laterality: Bilateral;  . CESAREAN SECTION    . ESOPHAGOGASTRODUODENOSCOPY  09/29/2011   Procedure: ESOPHAGOGASTRODUODENOSCOPY (EGD);  Surgeon: Petra Kuba, MD;  Location: [facility];  Service: Endoscopy;  Laterality: N/A;  . LAPAROSCOPIC BILATERAL SALPINGECTOMY Bilateral 02/13/2013   Procedure: LAPAROSCOPIC BILATERAL SALPINGECTOMY VIA FULGERATION;  Surgeon: Purcell Nails, MD;  Location: [facility];  Service: Gynecology;  Laterality: Bilateral;  Laparoscopic BTL via Fulguration/ possible Bilateral Salpingectomy     OB History   No obstetric history on file.     Family History  Problem Relation Age of Onset  . Healthy Mother     Social History   Tobacco Use  . Smoking status: Never Smoker  . Smokeless tobacco: Never Used  Substance Use Topics  . Alcohol use: No  . Drug use: No    Home Medications Prior to Admission medications   Medication Sig Start Date End Date Taking? Authorizing Provider  acetaminophen (TYLENOL) 325 MG tablet Take 650 mg by mouth every 6 (six) hours as needed for mild pain or headache.   Yes [provider]  albuterol (PROVENTIL HFA;VENTOLIN HFA) 108 (90 Base) MCG/ACT inhaler Inhale 1-2 puffs into the lungs every 6 (six) hours as needed for wheezing or shortness of breath. 06/08/18  Yes Domenick Gong, MD  benzonatate (TESSALON) 200 MG capsule Take 1 capsule (200 mg total) by mouth 3 (three) times daily as needed for cough. 06/08/18  Yes Domenick Gong, MD  pantoprazole (PROTONIX) 40 MG tablet Take 1 tablet (40  mg total) by mouth daily. 12/21/16  Yes Garnette Gunner, MD  dicyclomine (BENTYL) 20 MG tablet Take 1 tablet (20 mg total) by mouth 2 (two) times daily. 05/14/19   Micalah Cabezas C, PA-C  fluticasone (FLONASE) 50 MCG/ACT nasal spray Place 2 sprays into both nostrils daily. Patient not taking: Reported on 05/14/2019 06/08/18   Domenick Gong, MD  HYDROcodone-acetaminophen (NORCO/VICODIN)  5-325 MG tablet Take 1 tablet by mouth every 6 (six) hours as needed for severe pain. 05/14/19   Tericka Devincenzi C, PA-C  ibuprofen (ADVIL,MOTRIN) 600 MG tablet Take 1 tablet (600 mg total) by mouth every 6 (six) hours as needed. Patient not taking: Reported on 05/14/2019 06/08/18   Domenick Gong, MD  ondansetron (ZOFRAN ODT) 4 MG disintegrating tablet Take 1 tablet (4 mg total) by mouth every 8 (eight) hours as needed for nausea or vomiting. 05/14/19   Jatavion Peaster C, PA-C  potassium chloride SA (KLOR-CON) 20 MEQ tablet Take 1 tablet (20 mEq total) by mouth daily for 5 days. 05/14/19 05/19/19  Anselm Pancoast, PA-C  Spacer/Aero-Holding Chambers (AEROCHAMBER PLUS) inhaler Use as instructed 06/08/18   Domenick Gong, MD  witch hazel-glycerin (TUCKS) pad Apply topically as needed for itching. Patient not taking: Reported on 05/14/2019 12/20/16   Garnette Gunner, MD    Allergies    Patient has no known allergies.  Review of Systems   Review of Systems  Constitutional: Negative for chills and fever.  Respiratory: Negative for cough and shortness of breath.   Cardiovascular: Negative for chest pain and leg swelling.  Gastrointestinal: Positive for abdominal pain. Negative for blood in stool, diarrhea, nausea and vomiting.  Genitourinary: Negative for dysuria, flank pain, hematuria, vaginal bleeding and vaginal discharge.  Musculoskeletal: Negative for back pain.  Neurological: Negative for syncope and weakness.  All other systems reviewed and are negative.   Physical Exam Updated Vital Signs BP 107/84   Pulse 79   Temp 98.8 F (37.1 C) (Oral)   Resp 16   SpO2 100%   Physical Exam Vitals and nursing note reviewed.  Constitutional:      General: She is not in acute distress.    Appearance: She is well-developed. She is not diaphoretic.  HENT:     Head: Normocephalic and atraumatic.     Mouth/Throat:     Mouth: Mucous membranes are moist.     Pharynx: Oropharynx is clear.  Eyes:      Conjunctiva/sclera: Conjunctivae normal.  Cardiovascular:     Rate and Rhythm: Normal rate and regular rhythm.     Pulses: Normal pulses.          Radial pulses are 2+ on the right side and 2+ on the left side.       Posterior tibial pulses are 2+ on the right side and 2+ on the left side.     Heart sounds: Normal heart sounds.     Comments: Tactile temperature in the extremities appropriate and equal bilaterally. Pulmonary:     Effort: Pulmonary effort is normal. No respiratory distress.     Breath sounds: Normal breath sounds.  Abdominal:     General: Bowel sounds are normal.     Palpations: Abdomen is soft.     Tenderness: There is abdominal tenderness in the right lower quadrant. There is no guarding.  Musculoskeletal:     Cervical back: Neck supple.     Right lower leg: No edema.     Left lower leg: No edema.  Lymphadenopathy:  Cervical: No cervical adenopathy.  Skin:    General: Skin is warm and dry.  Neurological:     Mental Status: She is alert.  Psychiatric:        Mood and Affect: Mood and affect normal.        Speech: Speech normal.        Behavior: Behavior normal.     ED Results / Procedures / Treatments   Labs (all labs ordered are listed, but only abnormal results are displayed) Labs Reviewed  COMPREHENSIVE METABOLIC PANEL - Abnormal; Notable for the following components:      Result Value   Potassium 3.3 (*)    BUN 5 (*)    All other components within normal limits  CBC - Abnormal; Notable for the following components:   WBC 12.0 (*)    RBC 5.36 (*)    MCV 77.2 (*)    MCH 23.7 (*)    All other components within normal limits  URINALYSIS, ROUTINE W REFLEX MICROSCOPIC - Abnormal; Notable for the following components:   Color, Urine STRAW (*)    Hgb urine dipstick SMALL (*)    All other components within normal limits  DIFFERENTIAL - Abnormal; Notable for the following components:   Neutro Abs 8.5 (*)    Monocytes Absolute 1.2 (*)    All other  components within normal limits  I-STAT BETA HCG BLOOD, ED ([facility], [facility], [facility] ONLY) - Abnormal; Notable for the following components:   I-stat hCG, quantitative 11.2 (*)    All other components within normal limits  NOVEL CORONAVIRUS, NAA (HOSP ORDER, SEND-OUT TO REF LAB; TAT 18-24 HRS)  LIPASE, BLOOD  PREGNANCY, URINE    EKG None  Radiology CT ABDOMEN PELVIS W CONTRAST  Result Date: 05/14/2019 CLINICAL DATA:  Right lower quadrant pain. Appendicitis suspected. No nausea vomiting. No fever. EXAM: CT ABDOMEN AND PELVIS WITH CONTRAST TECHNIQUE: Multidetector CT imaging of the abdomen and pelvis was performed using the standard protocol following bolus administration of intravenous contrast. CONTRAST:  100mL OMNIPAQUE IOHEXOL 300 MG/ML  SOLN COMPARISON:  December 16, 2016 FINDINGS: Lower chest: No acute abnormality. Hepatobiliary: No focal liver abnormality is seen. No gallstones, gallbladder wall thickening, or biliary dilatation. Pancreas: Unremarkable. No pancreatic ductal dilatation or surrounding inflammatory changes. Spleen: Normal in size without focal abnormality. Adrenals/Urinary Tract: Adrenal glands are unremarkable. Kidneys are normal, without renal calculi, focal lesion, or hydronephrosis. Bladder is unremarkable. Stomach/Bowel: The stomach and small bowel are normal. Colon is normal. The appendix is unremarkable with no evidence of appendicitis. There is no periappendiceal stranding. Air is seen to the distal tip of the appendix. The appendix is normal in caliber. Vascular/Lymphatic: The abdominal aorta and branching vessels are normal. There are shotty nodes in the right lower quadrant mesentery which are larger when compared to August of 2018. A representative node measures 8 mm on series 3, image 45. The adjacent colon is unremarkable with no evidence of mass or pericolonic stranding. Reproductive: Uterus and bilateral adnexa are unremarkable. Other: A small amount of free fluid in the pelvis is  likely physiologic. No free air. 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 These findings are stable. No acute fractures. IMPRESSION: 1. The appendix is normal in appearance. No evidence of appendicitis. 2. Prominent shotty nodes in the right lower quadrant mesentery are likely infectious or reactive, larger since August of 2018. This could  be the source of the patient's pain. Follow-up imaging in 3-6 months could ensure return to normal. 3. Physiologic fluid in the pelvis. 4. No other acute abnormalities. Electronically Signed   By: Dorise Bullion III M.D   On: 05/14/2019 13:56    Procedures Procedures (including critical care time)  Medications Ordered in ED Medications  sodium chloride flush (NS) 0.9 % injection 3 mL (has no administration in time range)  morphine 4 MG/ML injection 4 mg (4 mg Intravenous Given 05/14/19 1244)  ondansetron (ZOFRAN) injection 4 mg (4 mg Intravenous Given 05/14/19 1244)  sodium chloride 0.9 % bolus 1,000 mL (1,000 mLs Intravenous New Bag/Given 05/14/19 1244)  iohexol (OMNIPAQUE) 300 MG/ML solution 100 mL (100 mLs Intravenous Contrast Given 05/14/19 1331)    ED Course  I have reviewed the triage vital signs and the nursing notes.  Pertinent labs & imaging results that were available during my care of the patient were reviewed by me and considered in my medical decision making (see chart for details).    MDM Rules/Calculators/A&P                      Patient presents with abdominal pain. Patient is nontoxic appearing, afebrile, not tachycardic, not tachypneic, not hypotensive, maintains excellent SPO2 on room air, and is in no apparent distress.  Mild leukocytosis present. Repeat abdominal exams benign. CT with some prominent mesenteric lymph nodes in the right lower quadrant without concerning changes to the appendix or underlying anatomy.  Typically,  patients with mild presentation have symptoms resolve spontaneously. In some cases, this presentation has been seen in COVID-19 infection.  Therefore, we will test the patient.  Test results pending at time of discharge. The patient was given instructions for home care as well as return precautions. Patient voices understanding of these instructions, accepts the plan, and is comfortable with discharge.  Findings and plan of care discussed with Gareth Morgan, MD.   Beryle Lathe was evaluated in Emergency Department on 05/14/2019 for the symptoms described in the history of present illness. She was evaluated in the context of the global COVID-19 pandemic, which necessitated consideration that the patient might be at risk for infection with the SARS-CoV-2 virus that causes COVID-19. Institutional protocols and algorithms that pertain to the evaluation of patients at risk for COVID-19 are in a state of rapid change based on information released by regulatory bodies including the CDC and federal and state organizations. These policies and algorithms were followed during the patient's care in the ED.  Vitals:   05/14/19 1156 05/14/19 1200 05/14/19 1215 05/14/19 1300  BP:  116/82 110/85 102/76  Pulse: 76 74 77 82  Resp:      Temp:      TempSrc:      SpO2: 95% 100% 100% 100%    Final Clinical Impression(s) / ED Diagnoses Final diagnoses:  Right lower quadrant abdominal pain  Hypokalemia  Nonspecific mesenteric adenitis    Rx / DC Orders ED Discharge Orders         Ordered    dicyclomine (BENTYL) 20 MG tablet  2 times daily     05/14/19 1445    ondansetron (ZOFRAN ODT) 4 MG disintegrating tablet  Every 8 hours PRN     05/14/19 1445    HYDROcodone-acetaminophen (NORCO/VICODIN) 5-325 MG tablet  Every 6 hours PRN     05/14/19 1445    potassium chloride SA (KLOR-CON) 20 MEQ tablet  Daily     05/14/19  [address]           Concepcion Living 05/14/19 1451    Alvira Monday, MD 05/17/19  1512

## 2019-05-14 NOTE — ED Notes (Signed)
Patient transported to CT 

## 2019-05-15 MED FILL — DICYCLOMINE 20 MG TABLET: 20 | 10 days supply | Qty: 20 | Fill #0

## 2019-05-15 MED FILL — HYDROCODON-APAP 5-325: 5-325 | 2 days supply | Qty: 6 | Fill #0

## 2019-05-15 MED FILL — POTASSIUM CHLORIDE CRYS ER: 20 | 5 days supply | Qty: 5 | Fill #0

## 2019-05-15 MED FILL — ONDANSETRON ODT 4 MG TABLET: 4 | 7 days supply | Qty: 20 | Fill #0

## 2019-05-17 LAB — NOVEL CORONAVIRUS, NAA (HOSP ORDER, SEND-OUT TO REF LAB; TAT 18-24 HRS): SARS-CoV-2, NAA: DETECTED — AB

## 2019-08-30 DIAGNOSIS — H5203 Hypermetropia, bilateral: Secondary | ICD-10-CM | POA: Diagnosis not present

## 2019-08-30 DIAGNOSIS — H524 Presbyopia: Secondary | ICD-10-CM | POA: Diagnosis not present

## 2019-10-12 ENCOUNTER — Ambulatory Visit: Payer: 59 | Admitting: Family Medicine

## 2019-10-19 ENCOUNTER — Ambulatory Visit: Payer: 59 | Admitting: Family Medicine

## 2019-12-15 ENCOUNTER — Ambulatory Visit: Payer: 59 | Admitting: Family Medicine

## 2019-12-15 ENCOUNTER — Other Ambulatory Visit: Payer: Self-pay

## 2019-12-15 ENCOUNTER — Encounter: Payer: Self-pay | Admitting: Family Medicine

## 2019-12-15 VITALS — BP 104/70 | HR 74 | Ht 62.0 in | Wt 155.0 lb

## 2019-12-15 DIAGNOSIS — M791 Myalgia, unspecified site: Secondary | ICD-10-CM

## 2019-12-15 DIAGNOSIS — R0789 Other chest pain: Secondary | ICD-10-CM

## 2019-12-15 MED ORDER — MELOXICAM 7.5 MG PO TABS: 7.5000 mg | ORAL_TABLET | Freq: Every day | ORAL | 0 refills | Status: AC

## 2019-12-15 MED FILL — MELOXICAM 7.5 MG TABLET: 7.5 | 14 days supply | Qty: 14 | Fill #0

## 2019-12-15 NOTE — Patient Instructions (Signed)
It was wonderful meeting you today.  We believe your pain is likely from some muscle soreness of your pectoralis or the muscles in between your ribs due to repetitive movements in your arms in this area.   I have sent an anti-inflammatory medication called meloxicam to your pharmacy that will help with pain.  This is once daily, do not take with any ibuprofen or Motrin.  You can also use Tylenol 650 mg every 4-6 hours.  Also use ice or heat 20 minutes at a time several times a day on this area.  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  If it is not improving in the next few weeks or sooner if you are having any shortness of breath, leg swelling, or can palpate a lump then please follow-up or go to the ED.

## 2019-12-15 NOTE — Assessment & Plan Note (Addendum)
Suspect likely costochondritis vs pectoralis soreness in the setting of repetitive motion and exquisitely tender to palpation around muscular/3-5th intercostal regions on the right.  Additionally considered PE given occasionally pleuritic with deep breaths, however likely due to irritation as above and reassuringly breathing comfortably with appropriate oxygen saturations, well score for PE/DVT 0 otherwise.  Doubt MI, nonanginal presentation with BMI 28 as only risk factor.  Recommended conservative management including short course of meloxicam (7-14 days), ice/heat, Tylenol, and modified mopping technique (alternating arms).

## 2019-12-15 NOTE — Progress Notes (Signed)
    SUBJECTIVE:   CHIEF COMPLAINT / HPI: "R chest pain"   [name] is a 39 year old female presenting discussed the following:  Primary language Montagnard (can speak english as well) and offered interpreter x3, however patient declined.  Right chest pain: Present for the past few days.  Exquisitely tender to pressing on her right anterior chest.  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  She has not tried any medications to help with the pain except hot showers on the area help.  Denies any associated shortness of breath, palpitations, leg swelling, diaphoresis.  Not pleuritic unless she takes a very deep breath and will feel the pain in that area.  No previous MI and chest pain not worse with activity.  No personal or family history of breast cancer, no associated nipple discharge/discomfort, or palpation of any lumps or bumps.   PERTINENT  PMH / PSH: Hemorrhoids, history of gastritis/ulcer, pyelonephritis in 2018  OBJECTIVE:   BP 104/70   Pulse 74   Ht 5\' 2"  (1.575 m)   Wt 155 lb (70.3 kg)   SpO2 98%   BMI 28.35 kg/m   Physical Exam Chest:     General: Alert, NAD, breathing comfortably  HEENT: NCAT, MMM Cardiac: RRR no m/g/r, exquisitely tender to palpation as above, in intercostal regions. Breasts: No palpation of masses on bilateral superior portions of breast and within axilla Lungs: Clear bilaterally, no increased WOB satting 98% on room air Abdomen: soft, non-tender Msk: Moves all extremities spontaneously  Ext: Warm, dry, 2+ distal pulses (radial and dorsalis pedis), no edema to bilateral lower extremity, no tenderness to palpation of calves bilaterally  ASSESSMENT/PLAN:   Right-sided chest wall pain Suspect likely costochondritis vs pectoralis soreness in the setting of repetitive motion and exquisitely tender to palpation around muscular/3-5th intercostal regions on the  right.  Additionally considered PE given occasionally pleuritic with deep breaths, however likely due to irritation as above and reassuringly breathing comfortably with appropriate oxygen saturations, well score for PE/DVT 0 otherwise.  Doubt MI, nonanginal presentation with BMI 28 as only risk factor.  Recommended conservative management including short course of meloxicam (7-14 days), ice/heat, Tylenol, and modified mopping technique (alternating arms).     Follow-up if not improving with measures above in the next 1-2 weeks or sooner if any SOB, diaphoresis, or leg swelling.  , DO [facility]

## 2020-03-28 IMAGING — CT CT ABD-PELV W/ CM
2 of 5 series · 16 of 46 positions shown, 18 images · IV contrast (APPLIED)
Comparison: December 16, 2016

CLINICAL DATA: Right lower quadrant pain. Appendicitis suspected.
No nausea vomiting. No fever.

EXAM:
CT ABDOMEN AND PELVIS WITH CONTRAST
TECHNIQUE: Multidetector CT imaging of the abdomen and pelvis was performed
using the standard protocol following bolus administration of
intravenous contrast.
CONTRAST:  100mL OMNIPAQUE IOHEXOL 300 MG/ML  SOLN

[Series 3: abd/ pelvis 5.0 i30f 2 · axial · 0.88mm/px · z∈[+930,+1345]mm · 13 of 93 slices shown, 15 images]
[im 5/93  soft-tissue]
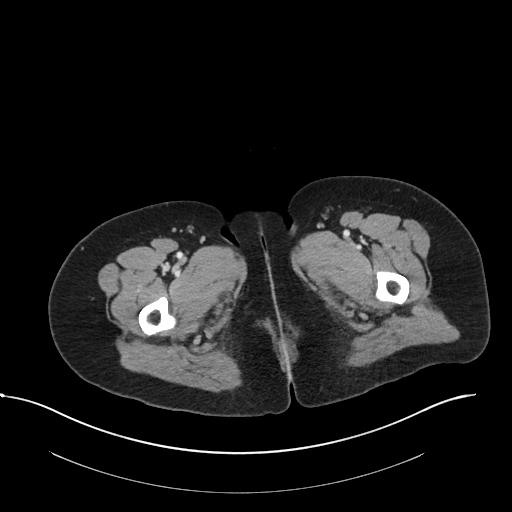
[im 5/93  bone]
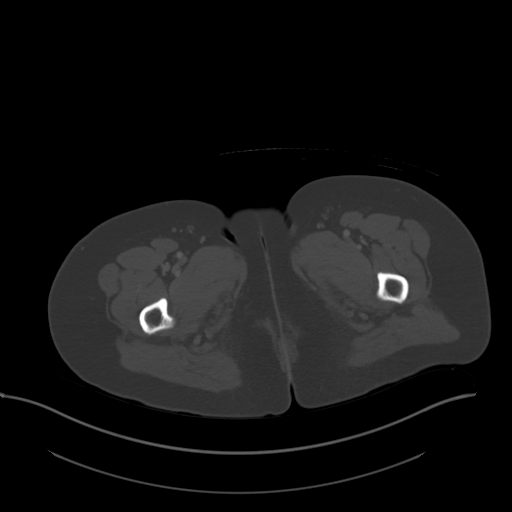
[im 14/93  soft-tissue]
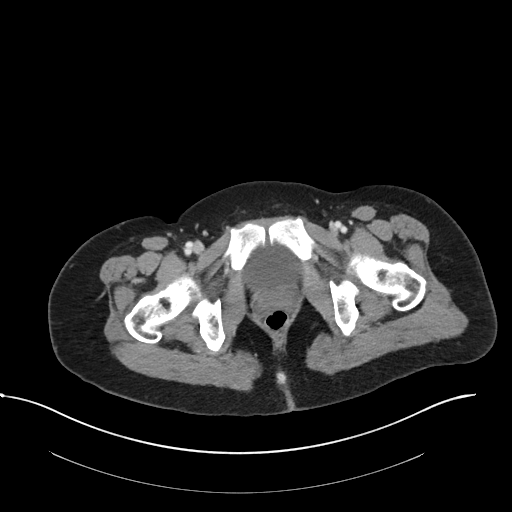
[im 19/93  soft-tissue]
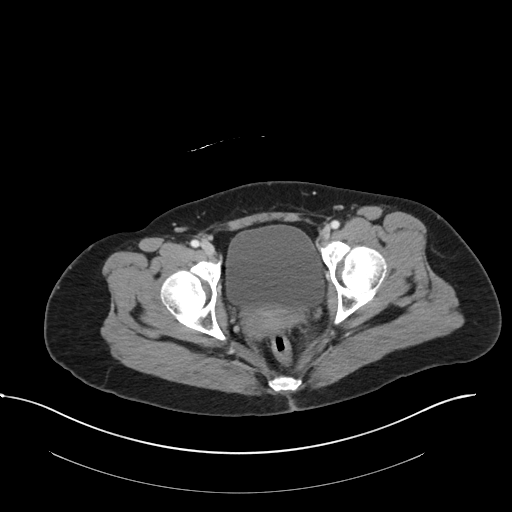
[im 28/93  soft-tissue]
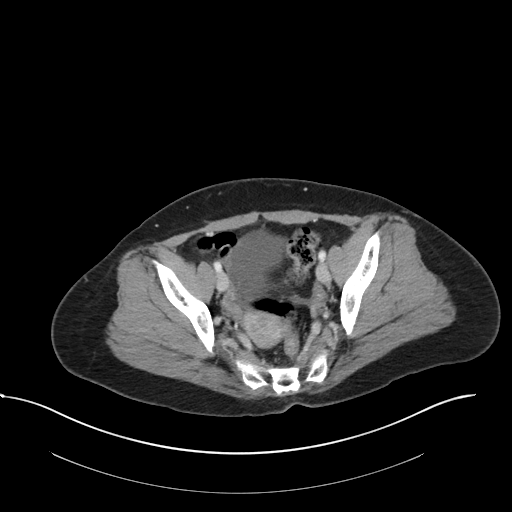
[im 33/93  soft-tissue]
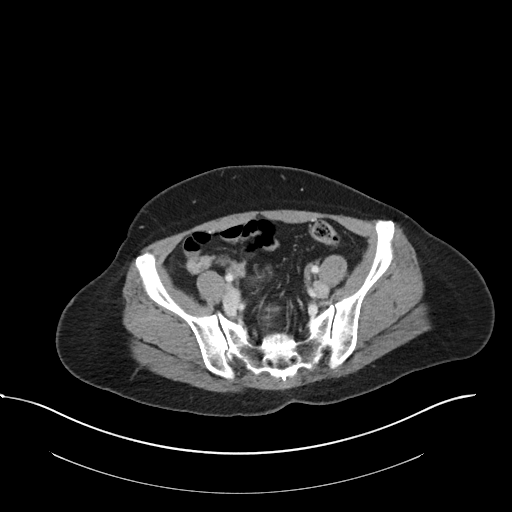
[im 42/93  soft-tissue]
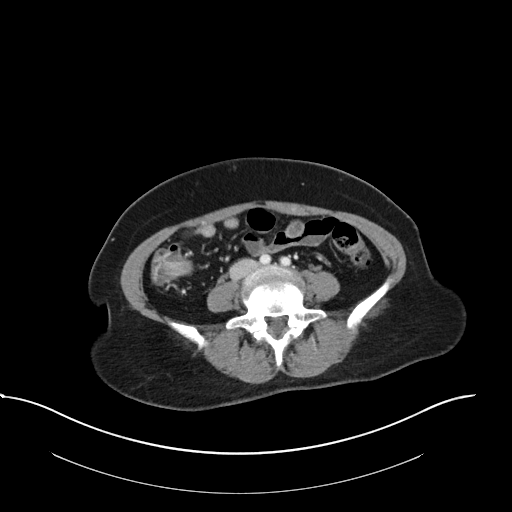
[im 47/93  soft-tissue]
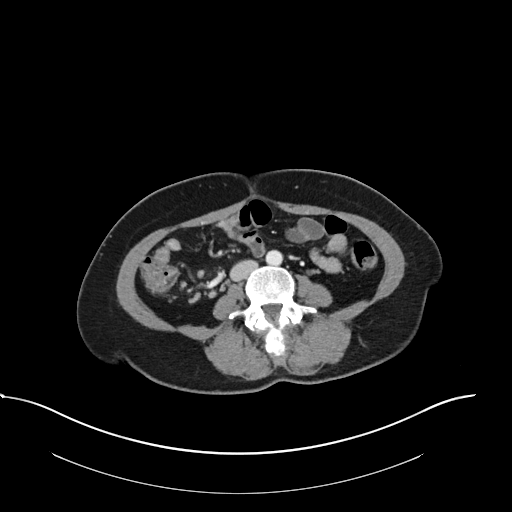
[im 51/93  soft-tissue]
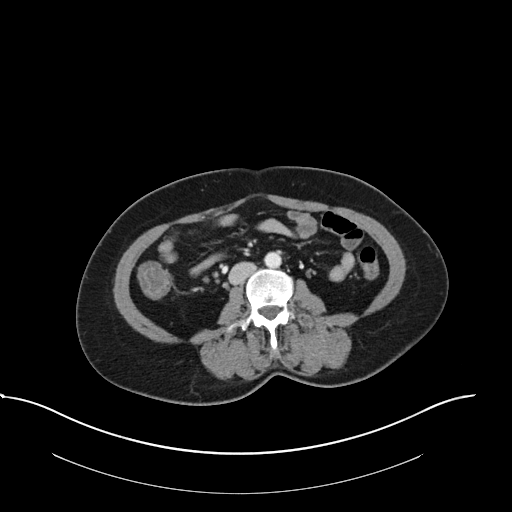
[im 60/93  soft-tissue]
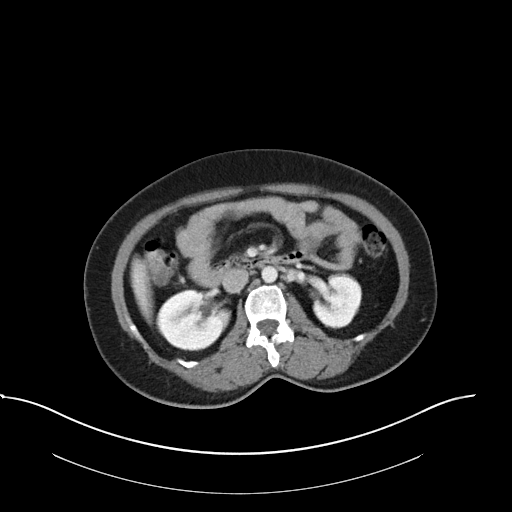
[im 60/93  bone]
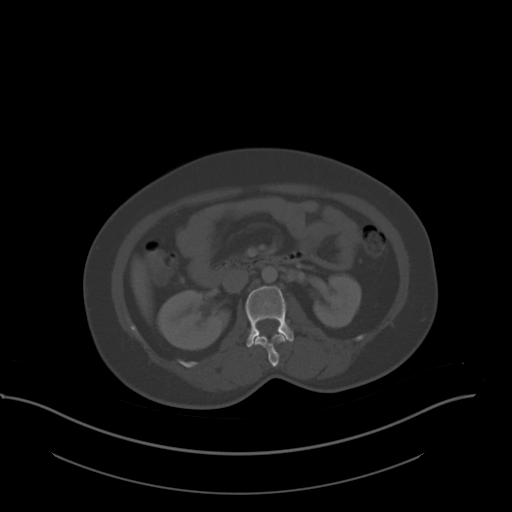
[im 65/93  soft-tissue]
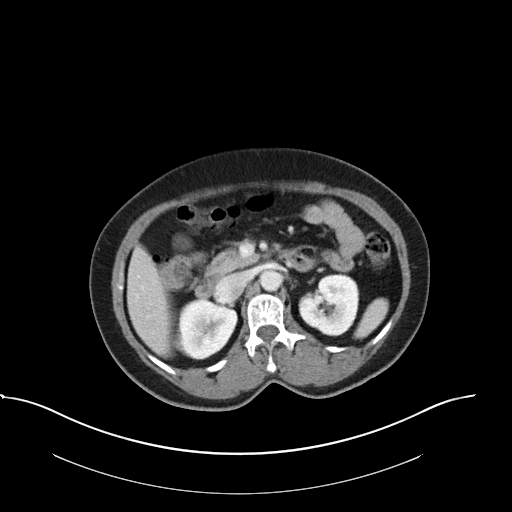
[im 74/93  soft-tissue]
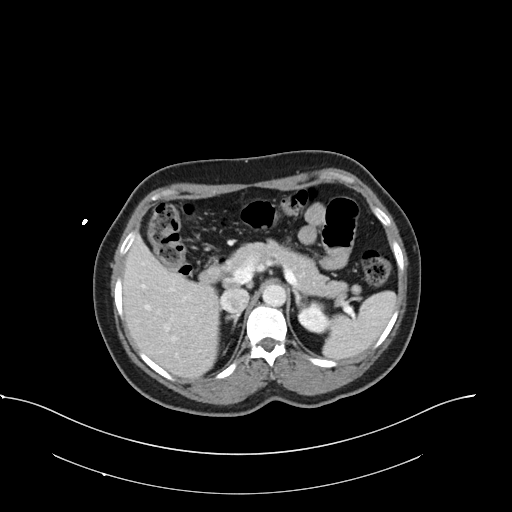
[im 79/93  soft-tissue]
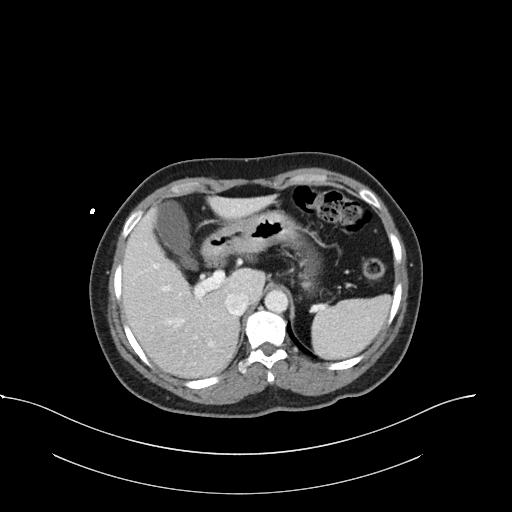
[im 88/93  soft-tissue]
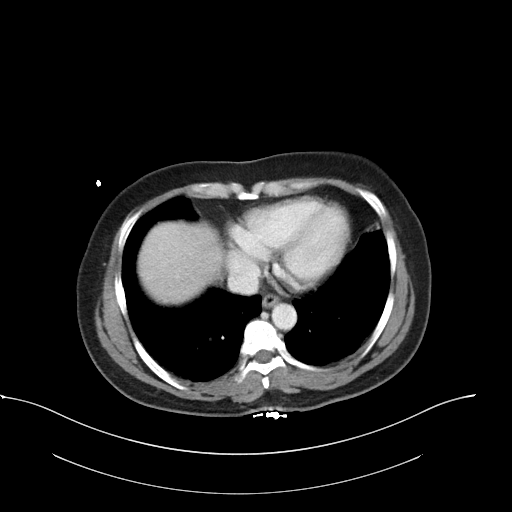

[Series 6: coronal soft tissue · coronal · 0.78mm/px · 3 of 94 slices shown]
[im 32/94  soft-tissue]
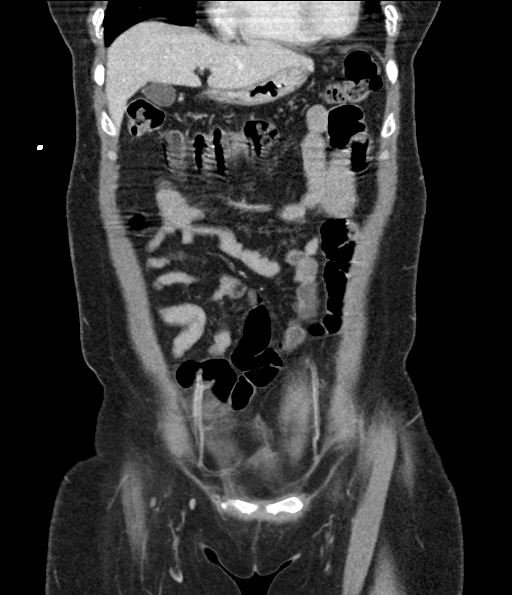
[im 42/94  soft-tissue]
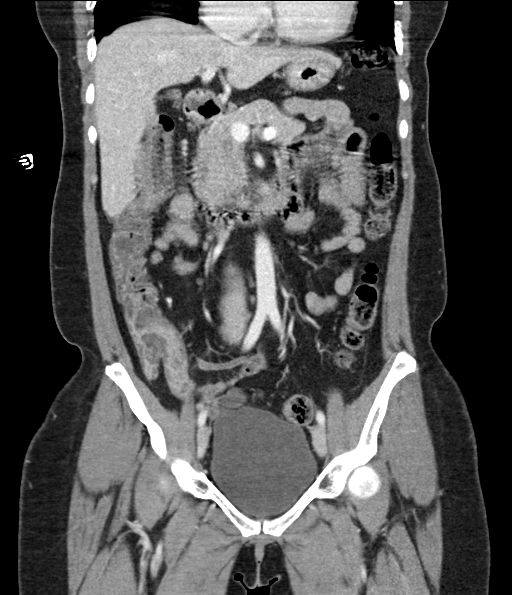
[im 52/94  soft-tissue]
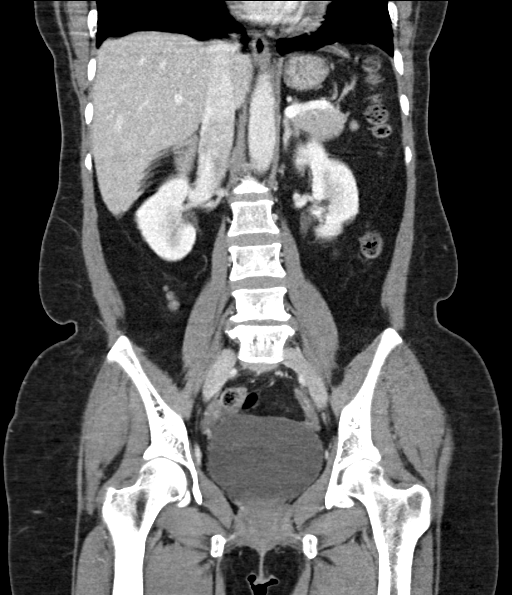

[16 of 46 positions shown; findings below may reference images not displayed]

FINDINGS: Lower chest: No acute abnormality.

Hepatobiliary: No focal liver abnormality is seen. No gallstones,
gallbladder wall thickening, or biliary dilatation.

Pancreas: Unremarkable. No pancreatic ductal dilatation or
surrounding inflammatory changes.

Spleen: Normal in size without focal abnormality.

Adrenals/Urinary Tract: Adrenal glands are unremarkable. Kidneys are
normal, without renal calculi, focal lesion, or hydronephrosis.
Bladder is unremarkable.

Stomach/Bowel: The stomach and small bowel are normal. Colon is
normal. The appendix is unremarkable with no evidence of
appendicitis. There is no periappendiceal stranding. Air is seen to
the distal tip of the appendix. The appendix is normal in caliber.

Vascular/Lymphatic: The abdominal aorta and branching vessels are
normal. There are shotty nodes in the right lower quadrant mesentery
which are larger when compared to Friday December, 2016. A representative
node measures 8 mm on series 3, image 45. The adjacent colon is
unremarkable with no evidence of mass or pericolonic stranding.

Reproductive: Uterus and bilateral adnexa are unremarkable.

Other: A small amount of free fluid in the pelvis is likely
physiologic. No free air.

Musculoskeletal: Bilateral pars defects are seen at L4 and L5 with
grade 1 anterolisthesis of L5 versus S1 and grade 1-2
anterolisthesis of L4 versus L5. These findings are stable. No acute
fractures.
IMPRESSION: 1. The appendix is normal in appearance. No evidence of
appendicitis.
2. Prominent shotty nodes in the right lower quadrant mesentery are
likely infectious or reactive, larger since Friday December, 2016. This
could be the source of the patient's pain. Follow-up imaging in 3-6
months could ensure return to normal.
3. Physiologic fluid in the pelvis.
4. No other acute abnormalities.

## 2020-04-24 MED FILL — IBUPROFEN 600 MG TABLET: 600 | 5 days supply | Qty: 20 | Fill #0

## 2020-04-24 MED FILL — PENICILLIN VK 500 MG TABLET: 500 | 7 days supply | Qty: 28 | Fill #0

## 2020-07-01 ENCOUNTER — Other Ambulatory Visit: Payer: Self-pay

## 2020-07-01 ENCOUNTER — Encounter: Payer: Self-pay | Admitting: Family Medicine

## 2020-07-01 ENCOUNTER — Ambulatory Visit: Payer: 59 | Admitting: Family Medicine

## 2020-07-01 VITALS — BP 115/83 | HR 67 | Ht 61.0 in | Wt 152.2 lb

## 2020-07-01 DIAGNOSIS — Z1159 Encounter for screening for other viral diseases: Secondary | ICD-10-CM

## 2020-07-01 DIAGNOSIS — Z23 Encounter for immunization: Secondary | ICD-10-CM | POA: Diagnosis not present

## 2020-07-01 DIAGNOSIS — E663 Overweight: Secondary | ICD-10-CM

## 2020-07-01 DIAGNOSIS — Z8639 Personal history of other endocrine, nutritional and metabolic disease: Secondary | ICD-10-CM | POA: Diagnosis not present

## 2020-07-01 NOTE — Progress Notes (Signed)
SUBJECTIVE:   CHIEF COMPLAINT / HPI:   Daughter present in the room and translating for mother in [place], per patient preference.  Back pain/coldness  concern for kidney infection Patient reports that she previously was hospitalized with a kidney infection and is concerned and wants to make sure that her kidneys are doing okay.  She was noted to previously be hypokalemic and wanted to make sure her potassium was still normal now.  Patient reports no urinary symptoms including dysuria, increased frequency, hesitancy. Of note, patient has a history of anterolisthesis pars defect L4-L5 grade 1 diagnosed in 2015.  Itching with defecation  Patient reports that she occasionally has itching with defecation that can last for several days but comes and goes.  She states she has had similar symptoms previously, but at that time she was straining.  Denies any blood on the toilet paper or in the bowl.  She feels that the itching is "on the inside" and does not endorse having any bulging or signs of hemorrhoids externally.  Denies issues with constipation or diarrhea.  Dizziness:  Patient reports that she has been having some episodes of dizziness while at work.  She states that sometimes she moves too fast when bending over or getting up and can get dizzy.  She also states that sometimes the dizziness goes away when she has a light snack or a soda.  She also states that previously when she was having dizziness she was told she had low potassium and that it got better after taking supplementation.   PERTINENT  PMH / PSH: Hemorrhoids  OBJECTIVE:   Ht 5\' 1"  (1.549 m)   Wt 152 lb 3.2 oz (69 kg)   BMI 28.76 kg/m   Gen: well-appearing, NAD CV: RRR, no m/r/g appreciated, no peripheral edema Pulm: CTAB, no wheezes/crackles GI: soft, non-tender, non-distended, negative for CVA tenderness MSK: Pain in lumbar spine at L4/L5 with forward bending, pain to palpation once patient is flexed forward but  no notable pain when upright.  ASSESSMENT/PLAN:  Back pain/sensory change Patient has a history of anterior listhesis pars defect L4-L5 grade 1 diagnosed in 2015.  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  Patient states that symptoms come and go, some improvement with taking OTC medications including Tylenol and ibuprofen.  At this time, it appears more likely to be MSK related than a kidney infection as there are no infectious signs and no urinary symptoms.  No red flag symptoms present - Recommended use of Tylenol and ibuprofen as needed - Counseled patient on other alternatives such as using heating pad, BenGay/IcyHot - Patient given return precautions and recommended to follow-up in the next 1 to 2 months.  Pruritus with defecation Patient symptoms appear to sound like either a mucosal fissure or internal hemorrhoids present.  Patient denies any blood when wiping or in stools.  Previous diagnosis of hemorrhoids with patient reporting similar symptoms.  Not likely to be other bowel disease as patient is not having diarrhea or constipation and no change in stool caliber or infectious signs. -Recommended OTC Preparation H -Return precautions given for blood in stools and worsening of symptoms  Dizziness Patient reports episodic dizziness associated with movements during work, including fast bending over/positional changes.  BP in office appropriate today, consideration for orthostatic hypotension.  Patient reports improvement of symptoms after rest and intake of sugary substances could also be related to either hypoglycemia or not eating  enough food throughout the day. - Patient counseled on slowing down positional changing movements - Patient counseled to ensure eating and drinking appropriate amounts during working hours - Patient given return precautions, if worsening of symptoms or presyncope/syncope, patient is to return to the office  for  Overweight (BMI 28.76)  Assessment for metabolic syndrome  - Checking lipid panel today - CMP to assess for NAFLD with LFTs   History of hypokalemia Patient's potassium on 05/14/2019 was 3.3.  Previous labs in 2018 also showed hypokalemia.  Patient reports previous need for potassium supplementation. - Checking CMP for hypokalemia  Evelena Leyden, DO [place] [facility]

## 2020-07-01 NOTE — Patient Instructions (Addendum)
It was so great seeing you today! Today we discussed the following:  Dizziness: If you are bending over and moving too fast or finding you are dizzy whenever you go from sitting to standing, make sure to do slower movements.  As of taking small breaks with pain.  Throughout the day to ensure that it is not lower blood sugars causing the dizziness.  If you start to have a feeling like the room is spinning or that you are about to pass out, or this becomes more frequent please come back to the office.  Itching with defecation: This could possibly be due to an internal hemorrhoid or a little bit of a tear in the area, if you start to notice a lot of blood or the symptoms start getting worse please let our office know.  At this time I would recommend getting an over-the-counter hemorrhoid cream such as Preparation H and see if that helps.  Back pain: I think the pain in your back is more likely related to a musculoskeletal issue rather than kidney infection.  I would try using Tylenol, heating pad, BenGay/IcyHot to see if that helps with your pain.  If this gets worse or if the symptoms do not get better, please come back to the office and we can consider doing other medications.

## 2020-07-02 LAB — COMPREHENSIVE METABOLIC PANEL
ALT: 18 IU/L (ref 0–32)
AST: 21 IU/L (ref 0–40)
Albumin/Globulin Ratio: 1.4 (ref 1.2–2.2)
Albumin: 4 g/dL (ref 3.8–4.8)
Alkaline Phosphatase: 71 IU/L (ref 44–121)
BUN/Creatinine Ratio: 17 (ref 9–23)
BUN: 11 mg/dL (ref 6–24)
Bilirubin Total: 0.4 mg/dL (ref 0.0–1.2)
CO2: 22 mmol/L (ref 20–29)
Calcium: 9.1 mg/dL (ref 8.7–10.2)
Chloride: 102 mmol/L (ref 96–106)
Creatinine, Ser: 0.64 mg/dL (ref 0.57–1.00)
Globulin, Total: 2.9 g/dL (ref 1.5–4.5)
Glucose: 78 mg/dL (ref 65–99)
Potassium: 3.7 mmol/L (ref 3.5–5.2)
Sodium: 140 mmol/L (ref 134–144)
Total Protein: 6.9 g/dL (ref 6.0–8.5)
eGFR: 113 mL/min/{1.73_m2} (ref >59)

## 2020-07-02 LAB — LIPID PANEL
Chol/HDL Ratio: 3 ratio (ref 0.0–4.4)
Cholesterol, Total: 193 mg/dL (ref 100–199)
HDL: 65 mg/dL (ref >39)
LDL Chol Calc (NIH): 101 mg/dL — ABNORMAL HIGH (ref 0–99)
Triglycerides: 155 mg/dL — ABNORMAL HIGH (ref 0–149)
VLDL Cholesterol Cal: 27 mg/dL (ref 5–40)

## 2020-07-02 LAB — HCV AB W REFLEX TO QUANT PCR: HCV Ab: <0.1 s/co ratio (ref 0.0–0.9)

## 2020-07-02 LAB — HCV INTERPRETATION

## 2020-07-04 ENCOUNTER — Encounter: Payer: Self-pay | Admitting: Family Medicine

## 2020-11-05 NOTE — Progress Notes (Addendum)
    SUBJECTIVE:   CHIEF COMPLAINT / HPI:   [name] is a 40 yo who presents for hand numbness. Endorses right arm numbness down to hand that started 1 week ago- woke up in the middle of the night to the numbness. States it comes and goes daily. Numbness occurs during day and at night. Denies any pain. Denies it happening previously. States the numbness occurs on the left side as well but not every day. Endorses shaking hands help alleviate the numbness. Works for [employer]	[place] so using her hands a lot.   Endorses sharp pain on her left heel that is worse when getting up from seated/laying to standing. Denies radiation, stays in the heel area. Started about a month ago and occurs intermittently not every day. Denies taking any medication  PERTINENT  PMH / PSH:  anterolisthesis pars defect L4-L5 grade 1 diagnosed in 2015 Hemorrhoids  OBJECTIVE:   BP 102/70   Pulse 64   Ht 5\' 1"  (1.549 m)   Wt 65.3 kg   SpO2 97%   BMI 27.21 kg/m    General: well appearing, pleasant, NAD CV: RRR no murmurs Resp: CTAB normal WOB MSK: UE non erythematous, non edematous bilaterally. Arm, elbow and wrists non tender to palpation. Sensation equal bilaterally Negative Phalen's and Tinel's test LE- L heel without erythema or edema. Tender to palpation  ASSESSMENT/PLAN:   No problem-specific Assessment & Plan notes found for this encounter.   Bilateral hand numbness Started at night 1 week ago, comes and goes daily, and shaking her arm helps alleviate the numbness. Numbness likely due to carpal tunnel syndrome. Although Phalen's and Tinel's tests are negative, her relief after shaking her arm is sensitive for carpal tunnel. Considered other. Considered other peripheral neuropathies such as ulnar neuropathy but does not fit the area of distribution.  - advised to use wrist brace at night  - avoid injury to hand during the day but can do subtle stretches which I provided - gave handout on care at  home  L heel pain Sharp heel pain when going from sitting to standing. No known injury or trauma. L heel tender to palpation.  - gave list of exercises to do  - can use tylenol/ibuprofen for pain relief   Advised to return in 3-4 weeks if symptoms do not improve   , DO [facility]

## 2020-11-06 ENCOUNTER — Other Ambulatory Visit: Payer: Self-pay

## 2020-11-06 ENCOUNTER — Encounter: Payer: Self-pay | Admitting: Family Medicine

## 2020-11-06 ENCOUNTER — Ambulatory Visit: Payer: 59 | Admitting: Family Medicine

## 2020-11-06 VITALS — BP 102/70 | HR 64 | Ht 61.0 in | Wt 144.0 lb

## 2020-11-06 DIAGNOSIS — M722 Plantar fascial fibromatosis: Secondary | ICD-10-CM

## 2020-11-06 DIAGNOSIS — G5603 Carpal tunnel syndrome, bilateral upper limbs: Secondary | ICD-10-CM | POA: Diagnosis not present

## 2020-11-06 NOTE — Patient Instructions (Signed)
It was great seeing you today!  Today you came in for hand numbness and L heel pain. I believe your numbness is due to carpal tunnel syndrome which occurs when the nerves to the hand are compressed. I included information below and exercises that can help strengthen your hands and wrists. I recommend you getting a wrist support brace which you can get from [facility] for about $7-15.   Your heel pain is likely due to plantar fasciitis which is due to inflammation of tissue that connects to your heel. I have attached information below as well. Exercises will be important to do for this. It can take time for your pain to subside.   Please return in about 4 weeks if your symptoms worsen or do not improve.  Feel free to call with any questions or concerns at any time, at [phone].   Take care,  Dr. Cora Collum [facility]    Carpal Tunnel Syndrome  Carpal tunnel syndrome is a condition that causes pain, weakness, and numbness in your hand and arm. Numbness is when you cannot feel an area in your body. The carpal tunnel is a narrow area that is on the palm side of your wrist. Repeated wrist motion or certain diseases may cause swelling in the tunnel. This swelling can pinch the main nerve in the wrist. This nerve is called themedian nerve. What are the causes? This condition may be caused by: Moving your hand and wrist over and over again while doing a task. Injury to the wrist. Arthritis. A sac of fluid (cyst) or abnormal growth (tumor) in the carpal tunnel. Fluid buildup during pregnancy. Use of tools that vibrate. Sometimes the cause is not known. What increases the risk? The following factors may make you more likely to have this condition: Having a job that makes you do these things: Move your hand over and over again. Work with tools that vibrate, such as drills or sanders. Being a woman. Having diabetes, obesity, thyroid problems, or kidney  failure. What are the signs or symptoms? Symptoms of this condition include: A tingling feeling in your fingers. Tingling or loss of feeling in your hand. Pain in your entire arm. This pain may get worse when you bend your wrist and elbow for a long time. Pain in your wrist that goes up your arm to your shoulder. Pain that goes down into your palm or fingers. Weakness in your hands. You may find it hard to grab and hold items. You may feel worse at night. How is this treated? This condition may be treated with: Lifestyle changes. You will be asked to stop or change the activity that caused your problem. Doing exercises and activities that make bones, muscles, and tendons stronger (physical therapy). Learning how to use your hand again (occupational therapy). Medicines for pain and swelling. You may have injections in your wrist. A wrist splint or brace. Surgery. Follow these instructions at home: If you have a splint or brace: Wear the splint or brace as told by your doctor. Take it off only as told by your doctor. Loosen the splint if your fingers: Tingle. Become numb. Turn cold and blue. Keep the splint or brace clean. If the splint or brace is not waterproof: Do not let it get wet. Cover it with a watertight covering when you take a bath or a shower. Managing pain, stiffness, and swelling If told, put ice on the painful area: If you have a removable splint  or brace, remove it as told by your doctor. Put ice in a plastic bag. Place a towel between your skin and the bag. Leave the ice on for 20 minutes, 2-3 times per day. Do not fall asleep with the cold pack on your skin. Take off the ice if your skin turns bright red. This is very important. If you cannot feel pain, heat, or cold, you have a greater risk of damage to the area. Move your fingers often to reduce stiffness and swelling. General instructions Take over-the-counter and prescription medicines only as told by your  doctor. Rest your wrist from any activity that may cause pain. If needed, talk with your boss at work about changes that can help your wrist heal. Do exercises as told by your doctor, physical therapist, or occupational therapist. Keep all follow-up visits. Contact a doctor if: You have new symptoms. Medicine does not help your pain. Your symptoms get worse. Get help right away if: You have very bad numbness or tingling in your wrist or hand. Summary Carpal tunnel syndrome is a condition that causes pain in your hand and arm. It is often caused by repeated wrist motions. Lifestyle changes and medicines are used to treat this problem. Surgery may help in very bad cases. Follow your doctor's instructions about wearing a splint, resting your wrist, keeping follow-up visits, and calling for help. This information is not intended to replace advice given to you by your health care provider. Make sure you discuss any questions you have with your healthcare provider. Document Revised: 08/31/2019 Document Reviewed: 08/31/2019 Elsevier Patient Education  2022 [employer] Inc[employer]     Plantar Fasciitis  Plantar fasciitis is a painful foot condition that affects the heel. It occurs when the band of tissue that connects the toes to the heel bone (plantar fascia) becomes irritated. This can happen as the result of exercising too much or doing other repetitive activities (overuse injury). Plantar fasciitis can cause mild irritation to severe pain that makes it difficult to walk or move. The pain is usually worse in the morning after sleeping, or after sitting or lying down for a period of time. Pain may also beworse after long periods of walking or standing. What are the causes? This condition may be caused by: Standing for long periods of time. Wearing shoes that do not have good arch support. Doing activities that put stress on joints (high-impact activities). This includes ballet and exercise that makes  your heart beat faster (aerobic exercise), such as running. Being overweight. An abnormal way of walking (gait). Tight muscles in the back of your lower leg (calf). High arches in your feet or flat feet. Starting a new athletic activity. What are the signs or symptoms? The main symptom of this condition is heel pain. Pain may get worse after the following: Taking the first steps after a time of rest, especially in the morning after awakening, or after you have been sitting or lying down for a while. Long periods of standing still. Pain may decrease after 30-45 minutes of activity, such as gentle walking. How is this diagnosed? This condition may be diagnosed based on your medical history, a physical exam, and your symptoms. Your health care provider will check for: A tender area on the bottom of your foot. A high arch in your foot or flat feet. Pain when you move your foot. Difficulty moving your foot. You may have imaging tests to confirm the diagnosis, such as: X-rays. Ultrasound. MRI. How is this  treated? Treatment for plantar fasciitis depends on how severe your condition is. Treatment may include: Rest, ice, pressure (compression), and raising (elevating) the affected foot. This is called RICE therapy. Your health care provider may recommend RICE therapy along with over-the-counter pain medicines to manage your pain. Exercises to stretch your calves and your plantar fascia. A splint that holds your foot in a stretched, upward position while you sleep (night splint). Physical therapy to relieve symptoms and prevent problems in the future. Injections of steroid medicine (cortisone) to relieve pain and inflammation. Stimulating your plantar fascia with electrical impulses (extracorporeal shock wave therapy). This is usually the last treatment option before surgery. Surgery, if other treatments have not worked after 12 months. Follow these instructions at home: Managing pain,  stiffness, and swelling  If directed, put ice on the painful area. To do this: Put ice in a plastic bag, or use a frozen bottle of water. Place a towel between your skin and the bag or bottle. Roll the bottom of your foot over the bag or bottle. Do this for 20 minutes, 2-3 times a day. Wear athletic shoes that have air-sole or gel-sole cushions, or try soft shoe inserts that are designed for plantar fasciitis. Elevate your foot above the level of your heart while you are sitting or lying down.  Activity Avoid activities that cause pain. Ask your health care provider what activities are safe for you. Do physical therapy exercises and stretches as told by your health care provider. Try activities and forms of exercise that are easier on your joints (low impact). Examples include swimming, water aerobics, and biking. General instructions Take over-the-counter and prescription medicines only as told by your health care provider. Wear a night splint while sleeping, if told by your health care provider. Loosen the splint if your toes tingle, become numb, or turn cold and blue. Maintain a healthy weight, or work with your health care provider to lose weight as needed. Keep all follow-up visits. This is important. Contact a health care provider if you have: Symptoms that do not go away with home treatment. Pain that gets worse. Pain that affects your ability to move or do daily activities. Summary Plantar fasciitis is a painful foot condition that affects the heel. It occurs when the band of tissue that connects the toes to the heel bone (plantar fascia) becomes irritated. Heel pain is the main symptom of this condition. It may get worse after exercising too much or standing still for a long time. Treatment varies, but it usually starts with rest, ice, pressure (compression), and raising (elevating) the affected foot. This is called RICE therapy. Over-the-counter medicines can also be used to  manage pain. This information is not intended to replace advice given to you by your health care provider. Make sure you discuss any questions you have with your healthcare provider. Document Revised: 08/07/2019 Document Reviewed: 08/07/2019 Elsevier Patient Education  2022 [employer].

## 2020-12-18 ENCOUNTER — Ambulatory Visit: Payer: 59 | Admitting: Family Medicine

## 2020-12-18 ENCOUNTER — Other Ambulatory Visit: Payer: Self-pay

## 2020-12-18 DIAGNOSIS — R202 Paresthesia of skin: Secondary | ICD-10-CM

## 2020-12-18 NOTE — Progress Notes (Signed)
    SUBJECTIVE:   CHIEF COMPLAINT / HPI:   Presents with bilateral hand numbness that has occurred for the past several months. She uses her hands and upper extremities a lot with her work. Numbness worse at night. Shaking his hands helps. Was last seen by me for this on 7/6 and recommended conservative management such as wrist braces at night, exercises, OTC pain medication all which have not helped.   OBJECTIVE:   BP 98/81   Pulse 68   Ht 5\' 1"  (1.549 m)   Wt 140 lb (63.5 kg)   SpO2 99%   BMI 26.45 kg/m    Physical exam  General: well appearing, NAD Cardiovascular: RRR, no murmurs Lungs: CTAB. Normal WOB Abdomen: soft, non-distended Skin: warm, dry. No edema Extremities: R and L hands without erythema, edema, or visible lesions. Non tender to palpation. Sensation equal bilaterally. Normal strength and ROM. Negative Phalen's and Tinel's test  ASSESSMENT/PLAN:   No problem-specific Assessment & Plan notes found for this encounter.   Bilateral hand paresthesias  Patient presents with continued bilateral hand numbness and tingling not relieved by conservative measures such as wrist brace, hand exercises, OTC pain medication. Will check for potential metabolic causes such as HIV, syphillis, TSH, Vit B12. Referred to neurology to perform nerve conduction study  , DO [facility]

## 2020-12-18 NOTE — Patient Instructions (Signed)
It was great seeing you today!  Today you came in for your hand numbness. I have placed a referral to neurology who call you to schedule an appontment and will perform a nerve conduction study. I am also checking labs that could potentially cause your numbness  If you do not hear from the neurology office in the next 2 weeks please call us at the clinic to follow up on it.   Feel free to call with any questions or concerns at any time, at [phone].   Take care,  Dr. Cora Collum [facility]

## 2020-12-19 ENCOUNTER — Encounter: Payer: Self-pay | Admitting: Family Medicine

## 2020-12-19 LAB — HIV ANTIBODY (ROUTINE TESTING W REFLEX): HIV Screen 4th Generation wRfx: NONREACTIVE

## 2020-12-19 LAB — TSH: TSH: 1.26 u[IU]/mL (ref 0.450–4.500)

## 2020-12-19 LAB — RPR: RPR Ser Ql: NONREACTIVE

## 2020-12-19 LAB — VITAMIN B12: Vitamin B-12: 712 pg/mL (ref 232–1245)

## 2020-12-19 NOTE — Progress Notes (Signed)
Letter sent to patient re: normal labs. Awaiting appointment with Neurology

## 2021-09-01 ENCOUNTER — Encounter: Payer: Self-pay | Admitting: Family Medicine

## 2021-09-01 ENCOUNTER — Other Ambulatory Visit (HOSPITAL_COMMUNITY): Payer: Self-pay

## 2021-09-01 ENCOUNTER — Ambulatory Visit (INDEPENDENT_AMBULATORY_CARE_PROVIDER_SITE_OTHER): Payer: 59 | Admitting: Family Medicine

## 2021-09-01 ENCOUNTER — Other Ambulatory Visit: Payer: Self-pay

## 2021-09-01 VITALS — BP 107/87 | HR 70 | Wt 149.6 lb

## 2021-09-01 DIAGNOSIS — Z23 Encounter for immunization: Secondary | ICD-10-CM

## 2021-09-01 DIAGNOSIS — G44209 Tension-type headache, unspecified, not intractable: Secondary | ICD-10-CM | POA: Diagnosis not present

## 2021-09-01 DIAGNOSIS — Z0000 Encounter for general adult medical examination without abnormal findings: Secondary | ICD-10-CM | POA: Diagnosis not present

## 2021-09-01 DIAGNOSIS — R519 Headache, unspecified: Secondary | ICD-10-CM | POA: Insufficient documentation

## 2021-09-01 MED ORDER — SUMATRIPTAN SUCCINATE 25 MG PO TABS
25.0000 mg | ORAL_TABLET | ORAL | 0 refills | Status: AC | PRN
  Filled 2021-09-01: qty 10, 30d supply, fill #0

## 2021-09-01 NOTE — Assessment & Plan Note (Signed)
Patient presents with throbbing headaches about twice a week for several hours not improved with Tylenol. Not worsened by light or sound. No nausea or vision changes. Normal neuro exam. Prescribed Sumatriptan to take as needed. Strict return precautions provided.   ?

## 2021-09-01 NOTE — Patient Instructions (Addendum)
It was great seeing you today! ? ?You came in for your physical and I am glad to see you are doing well!  You also received your Tdap today ? ?Since you are having headaches a couple of times a week I have prescribed a medication called sumatriptan that you will take once you start to feel your headache, and can take it every 2 hours as needed.  Please let me know if you have any side effects or if this does not help your headaches and we can try something different but this should be helpful. ? ?For your wrist numbness the referral was placed last year to [facility]. You can call [phone] to schedule an appointment. If they need a new referral please give me a call at the clinic and I can place another one.  ? ? ?Visit Reminders: ?- Stop by the pharmacy to pick up your prescriptions  ?- Continue to work on your healthy eating habits and incorporating exercise into your daily life.  ? ? ?Feel free to call with any questions or concerns at any time, at [phone]. ?  ?Take care,  ?Dr. Cora Collum ?[facility]  ?

## 2021-09-01 NOTE — Progress Notes (Signed)
? ? ?  SUBJECTIVE:  ? ?Chief compliant/HPI: annual examination ? ?[name] is a 41 y.o. who presents today for an annual exam.  ? ?Patient states th at she has headaches that occurs about 2 days a week. Lasts several hours. Hurts on left side of her head. Throbbing. Will take Tylenol which eases it a bit. Denies worsening with light or sound. No nausea. Occurs about 2 days a week. Lasts several hours. No current headache today.  ? ?Still has wrist pain from repetitive movements at work. Has been using wrist brace at night. Was never contacted by specialist about nerve conduction study.  ? ?Not on any medication other than as needed Tylenol  ? ?States she sometimes will eat healthy. Eats vegetables and fruits. No juices or sodas  ?No exercise but is on her feet a lot  ? ?No tobacco use, alcohol, or marijuana ? ?No concerns about anxiety depression. Mood good.  ? ?Married with 2 kids at home.  ? ? ?OBJECTIVE:  ? ?BP 107/87   Pulse 70   Wt 149 lb 9.6 oz (67.9 kg)   SpO2 100%   BMI 28.27 kg/m?   ? ?Physical exam ?General: well appearing, NAD ?Neuro: PERRL. EOMI. No focal deficits. Normal sensation  ?Cardiovascular: RRR, no murmurs ?Lungs: CTAB. Normal WOB ?Abdomen: soft, non-distended, non-tender ?Skin: warm, dry. No edema ? ? ?ASSESSMENT/PLAN:  ? ?Headache ?Patient presents with throbbing headaches about twice a week for several hours not improved with Tylenol. Not worsened by light or sound. No nausea or vision changes. Normal neuro exam. Prescribed Sumatriptan to take as needed. Strict return precautions provided.   ?  ?Annual Examination  ?See AVS for age appropriate recommendations.   ?PHQ score 2, reviewed and discussed.  ?Blood pressure reviewed and at goal.  ? ?Considered the following items based upon USPSTF recommendations: ?Diabetes screening: not ordered ?Screening for elevated cholesterol:  not ordered  ?HIV testing:  not indicated  ?Syphilis if at high risk:  not indicated  ?GC/CT not at high risk  and not ordered. ?Reviewed risk factors for latent tuberculosis and not indicated ?  ?Cervical cancer screening: prior Pap reviewed, repeat due in October of this year  ?Vaccines: Tdap given today  ? ?Follow up in October for pap smear   ? ? ?Cora Collum, DO ?[facility]   ?

## 2021-12-08 DIAGNOSIS — H524 Presbyopia: Secondary | ICD-10-CM | POA: Diagnosis not present

## 2021-12-08 DIAGNOSIS — H52221 Regular astigmatism, right eye: Secondary | ICD-10-CM | POA: Diagnosis not present

## 2021-12-08 DIAGNOSIS — H5203 Hypermetropia, bilateral: Secondary | ICD-10-CM | POA: Diagnosis not present

## 2022-08-31 ENCOUNTER — Encounter: Payer: Self-pay | Admitting: Family Medicine

## 2022-08-31 ENCOUNTER — Ambulatory Visit: Payer: Commercial Managed Care - PPO | Admitting: Family Medicine

## 2022-08-31 ENCOUNTER — Other Ambulatory Visit (HOSPITAL_COMMUNITY): Payer: Self-pay

## 2022-08-31 VITALS — BP 124/78 | HR 80 | Ht 61.0 in | Wt 148.0 lb

## 2022-08-31 DIAGNOSIS — K219 Gastro-esophageal reflux disease without esophagitis: Secondary | ICD-10-CM | POA: Diagnosis not present

## 2022-08-31 DIAGNOSIS — R1013 Epigastric pain: Secondary | ICD-10-CM | POA: Diagnosis not present

## 2022-08-31 MED ORDER — OMEPRAZOLE 40 MG PO CPDR
40.0000 mg | DELAYED_RELEASE_CAPSULE | Freq: Every day | ORAL | 3 refills | Status: DC
Start: 2022-08-31 — End: 2023-06-09
  Filled 2022-08-31: qty 30, 30d supply, fill #0
  Filled 2023-02-26: qty 30, 30d supply, fill #1

## 2022-08-31 NOTE — Patient Instructions (Signed)
It was wonderful to see you today! Thank you for choosing [facility].   Please bring ALL of your medications with you to every visit.   Today we talked about:  I would like you to start taking a medication to help with reflux called Omeprazole. Please take it every day. In addition to the medication, please adjust your diet to help relieve symptoms.  Please follow up in 1 month to assess for symptom improvement  If you haven't already, sign up for My Chart to have easy access to your labs results, and communication with your primary care physician.  Call the clinic at [phone] if your symptoms worsen or you have any concerns.  Please be sure to schedule follow up at the front desk before you leave today.   Elberta Fortis, DO Family Medicine

## 2022-08-31 NOTE — Progress Notes (Unsigned)
    SUBJECTIVE:   CHIEF COMPLAINT / HPI:   Acid reflux Patient endorses epigastric pain and burning in the back of her throat  x 2 weeks. States it is worse after eating spicy food but the pain is present all the time. Diet mostly consists of rice, protein and vegetable at each meal. Daily Bms, denies blood in stool. Denies N/V. Denies weight loss  PERTINENT  PMH / PSH: h/o gastric ulcer  OBJECTIVE:   BP 124/78   Pulse 80   Ht 5\' 1"  (1.549 m)   Wt 148 lb (67.1 kg)   BMI 27.96 kg/m    General: NAD, pleasant, able to participate in exam Cardiac: RRR, no murmurs. Respiratory: CTAB, normal effort, No wheezes, rales or rhonchi Abdomen: Bowel sounds present, nondistended. TTP over epigastric region Extremities: no edema or cyanosis. Skin: warm and dry, no rashes noted Neuro: alert, no obvious focal deficits Psych: Normal affect and mood  ASSESSMENT/PLAN:   GERD (gastroesophageal reflux disease) Epigastric pain and burning in throat most consistent with GERD. Triggered by spicy food. H/o gastric ulcer and H pylori. No alarm symptoms present. -Start Omeprazole 40mg  daily -Recommend adjustment to diet to avoid triggering foods (hand out provided)    Dr. Elberta Fortis, DO [facility]    {    This will disappear when note is signed, click to select method of visit    :1}

## 2022-08-31 NOTE — Assessment & Plan Note (Signed)
Epigastric pain and burning in throat most consistent with GERD. Triggered by spicy food. H/o gastric ulcer and H pylori. No alarm symptoms present. -Start Omeprazole 40mg  daily -Recommend adjustment to diet to avoid triggering foods (hand out provided)

## 2022-09-02 ENCOUNTER — Other Ambulatory Visit: Payer: Commercial Managed Care - PPO

## 2022-09-02 DIAGNOSIS — R1013 Epigastric pain: Secondary | ICD-10-CM | POA: Diagnosis not present

## 2022-09-04 ENCOUNTER — Encounter: Payer: Self-pay | Admitting: Family Medicine

## 2022-09-04 LAB — H. PYLORI BREATH TEST: H pylori Breath Test: NEGATIVE

## 2023-02-26 ENCOUNTER — Other Ambulatory Visit (HOSPITAL_COMMUNITY): Payer: Self-pay

## 2023-06-07 ENCOUNTER — Ambulatory Visit: Payer: Commercial Managed Care - PPO | Admitting: Student

## 2023-06-09 ENCOUNTER — Encounter: Payer: Self-pay | Admitting: Student

## 2023-06-09 ENCOUNTER — Other Ambulatory Visit (HOSPITAL_COMMUNITY): Payer: Self-pay

## 2023-06-09 ENCOUNTER — Ambulatory Visit (INDEPENDENT_AMBULATORY_CARE_PROVIDER_SITE_OTHER): Payer: Commercial Managed Care - PPO | Admitting: Student

## 2023-06-09 DIAGNOSIS — K219 Gastro-esophageal reflux disease without esophagitis: Secondary | ICD-10-CM

## 2023-06-09 MED ORDER — OMEPRAZOLE 40 MG PO CPDR
40.0000 mg | DELAYED_RELEASE_CAPSULE | Freq: Every day | ORAL | 3 refills | Status: AC
Start: 2023-06-09 — End: ?
  Filled 2023-06-09: qty 30, 30d supply, fill #0
  Filled 2024-01-04: qty 30, 30d supply, fill #1

## 2023-06-09 NOTE — Patient Instructions (Signed)
 Pleasure to meet you today.  I have sent in prescription of omeprazole  40 mg which you take once daily.  Make sure to take this 30 minutes before eating anything.  Follow-up in 2 weeks lets see if there is any improvement in his symptoms.

## 2023-06-09 NOTE — Progress Notes (Signed)
    SUBJECTIVE:   CHIEF COMPLAINT / HPI:   43 Hx of GERD and Gastric Ulcer Abdominal pain for 3 months Pain is usually random, can come on at rest or while doing chores Tried Tylenol  and herbal tea with mild relieve  Denies use of NSAID. Daily normal BM. No N/V or blood stool  PERTINENT  PMH / PSH: Reviewed   OBJECTIVE:   BP 107/78   Pulse 61   Ht 5' 1 (1.549 m)   Wt 145 lb 12.8 oz (66.1 kg)   SpO2 98%   BMI 27.55 kg/m    Physical Exam General: Alert, well appearing, NAD, Oriented x4 Cardiovascular: RRR, No Murmurs, Normal S2/S2 Respiratory: CTAB, No wheezing or Rales Abdomen: No distension or tenderness Extremities: No edema on extremities   Skin: Warm and dry  ASSESSMENT/PLAN:   Abdominal Pain Chronic abdominal pain.  Unclear cause of patient's pain but differentials including gastroparesis, GERD, cholelithiasis, acute/chronic pancreatitis, appendicitis.  Given associated heartburn concern of GERD.  Will treat with PPI at this time.  No red flag symptoms such as hematochezia or significant weight loss.  Low concerns for inflammatory or irritable bowel syndrome given normal bowel habits and absences constitutional symptoms. Will treat with 2 weeks trial of PPI with plans to follow-up in 2 weeks. ED Precautions discussed with patient who verbalized understanding.   Norleen April, MD [facility]

## 2023-06-28 ENCOUNTER — Ambulatory Visit: Payer: Self-pay | Admitting: Student

## 2023-07-07 ENCOUNTER — Ambulatory Visit

## 2023-07-07 VITALS — BP 115/87 | HR 66 | Ht 61.0 in | Wt 145.4 lb

## 2023-07-07 DIAGNOSIS — Z23 Encounter for immunization: Secondary | ICD-10-CM | POA: Diagnosis not present

## 2023-07-07 DIAGNOSIS — K625 Hemorrhage of anus and rectum: Secondary | ICD-10-CM

## 2023-07-07 NOTE — Progress Notes (Signed)
  SUBJECTIVE:   CHIEF COMPLAINT / HPI:   Blood in stool: significant bright red blood in toilet bowl started Thursday night and continued Friday and Saturday. She states it was mixed with diarrhea. Recently started on Protonix 1 month ago. Her bloody stool stopped Sunday and Monday but she had another episode yesterday. Denies fever, nausea, vomiting, abdominal pain.   [place] interpreter used throughout encounter.  PERTINENT  PMH / PSH: GERD, hemorrhoids  OBJECTIVE:  BP 115/87   Pulse 66   Ht 5\' 1"  (1.549 m)   Wt 145 lb 6.4 oz (66 kg)   SpO2 99%   BMI 27.47 kg/m  General: Well-appearing, NAD HEENT: No conjunctival pallor CV: RRR, was auscultated Pulm: Normal WOB Abdomen: Soft, nontender, normoactive bowel sounds, no hepatomegaly Rectal: No appreciable hemorrhoids, absence of blood on exam, chaperoned by Penni Bombard, CMA  ASSESSMENT/PLAN:   Assessment & Plan Bright red rectal bleeding Unremarkable physical exam including rectal exam, no appreciable hemorrhoids. Recommend continuation of PPI.  Refrain from use of NSAIDs.  ED precautions discussed.  Check CBC and refer to GI for consideration of colonoscopy.  Return in about 1 month (around 08/07/2023) for Pap smear. Shelby Mattocks, DO 07/07/2023, 11:21 AM PGY-3, [facility]

## 2023-07-07 NOTE — Patient Instructions (Addendum)
 It was great to see you today! Thank you for choosing [facility] for your primary care.  Today we addressed: I am referring you to GI.  We are checking your blood levels today.  Please refrain from using any NSAIDs (ibuprofen, aspirin, naproxen).  Please continue your Protonix.  Should you continue to have these episodes of bleeding and get to the point where you feel weak, lightheaded, short of breath please proceed to the ED. I do recommend you return at some point to have Pap smear for cervical cancer screening.  If you haven't already, sign up for My Chart to have easy access to your labs results, and communication with your primary care physician. We are checking some labs today. If they are abnormal, I will call you. If they are normal, I will send you a MyChart message (if it is active) or a letter in the mail. If you do not hear about your labs in the next 2 weeks, please call the office. Return in about 1 month (around 08/07/2023) for Pap smear. Please arrive 15 minutes before your appointment to ensure smooth check in process.  We appreciate your efforts in making this happen.  Thank you for allowing me to participate in your care, Shelby Mattocks, DO 07/07/2023, 11:16 AM PGY-3, [facility]

## 2023-07-08 ENCOUNTER — Encounter: Payer: Self-pay | Admitting: Student

## 2023-07-08 LAB — CBC
Hematocrit: 41.3 % (ref 34.0–46.6)
Hemoglobin: 12.7 g/dL (ref 11.1–15.9)
MCH: 23.9 pg — ABNORMAL LOW (ref 26.6–33.0)
MCHC: 30.8 g/dL — ABNORMAL LOW (ref 31.5–35.7)
MCV: 78 fL — ABNORMAL LOW (ref 79–97)
Platelets: 225 10*3/uL (ref 150–450)
RBC: 5.32 x10E6/uL — ABNORMAL HIGH (ref 3.77–5.28)
RDW: 14.2 % (ref 11.7–15.4)
WBC: 8.1 10*3/uL (ref 3.4–10.8)

## 2023-08-02 ENCOUNTER — Ambulatory Visit

## 2023-08-02 NOTE — Progress Notes (Deleted)
  SUBJECTIVE:   CHIEF COMPLAINT / HPI:   Stye on eye  PERTINENT  PMH / PSH: ***  Past Medical History:  Diagnosis Date   Abdominal pain    Bacteremia    Constipation 10/25/2014   Gastric ulcer 06/25/2011   Gastritis 08/07/2011   GERD (gastroesophageal reflux disease)    Hemorrhoids    Pap smear for cervical cancer screening 09/02/2011   Pyelonephritis, acute 10/28/2011   Sepsis (HCC)    OBJECTIVE:  There were no vitals taken for this visit. ***  ASSESSMENT/PLAN:   Assessment & Plan  No follow-ups on file. Bess Kinds, MD 08/02/2023, 6:52 AM PGY-***, [facility] {    This will disappear when note is signed, click to select method of visit    :1}

## 2023-08-26 ENCOUNTER — Encounter: Payer: Self-pay | Admitting: Family Medicine

## 2023-09-15 ENCOUNTER — Encounter: Payer: Self-pay | Admitting: Student

## 2023-09-15 ENCOUNTER — Other Ambulatory Visit (HOSPITAL_COMMUNITY): Payer: Self-pay

## 2023-09-15 ENCOUNTER — Ambulatory Visit (INDEPENDENT_AMBULATORY_CARE_PROVIDER_SITE_OTHER): Admitting: Student

## 2023-09-15 VITALS — BP 117/90 | HR 60 | Ht 61.0 in | Wt 143.6 lb

## 2023-09-15 DIAGNOSIS — K625 Hemorrhage of anus and rectum: Secondary | ICD-10-CM

## 2023-09-15 DIAGNOSIS — H0015 Chalazion left lower eyelid: Secondary | ICD-10-CM

## 2023-09-15 MED ORDER — SYSTANE HYDRATION PF 0.4-0.3 % OP SOLN
1.0000 [drp] | Freq: Two times a day (BID) | OPHTHALMIC | 2 refills | Status: AC
  Filled 2023-09-15: qty 30, fill #0

## 2023-09-15 NOTE — Patient Instructions (Addendum)
 Ms. Slowey,  I believe you have what is called a chalazion. This is similar to a stye. Unfortunately there's not a great treatment for this. Time is the best therapy. You can also use a warm washcloth compress four times daily to hurry things along. If it does not get better within four weeks or gets worse, let us  know and we can consider sending you to an eye doctor to consider drainage.   Please call [phone] to make an appointment with the GI doctors. This is important!   I'd recommend stopping the red relief eye drops and transitioning just to regular lubricating eye drops   J Lark Plum, MD

## 2023-09-15 NOTE — Progress Notes (Unsigned)
    SUBJECTIVE:   CHIEF COMPLAINT / HPI:   Left Eye Lesion Present since Saturday 5/10. Not particularly painful, but she notices it when she blinks. Does have a history of a stye to the right eye for the past several weeks that is just now resolving. Also notes small red spot under left eye also since Saturday, her daughter thinks she may have scratched/scraped herself here.  No vision changes or pain with EOMs. No discharge.   BRBPR Seen for bright red blood per rectum back in March with Dr. Janae Mclean. Was referred to GI, has not heard back from there or been seen. No further episodes of BRBPR.    OBJECTIVE:   BP (!) 117/90   Pulse 60   Ht 5\' 1"  (1.549 m)   Wt 143 lb 9.6 oz (65.1 kg)   SpO2 100%   BMI 27.13 kg/m   Gen: Well appearing HENT: Very small stye to R eye, appears to be resolving. L eye with chalazion of lower lid. Small abrasion/bruise just below the L eye. No conjunctival injection. EOMs and visual fields are intact.          ASSESSMENT/PLAN:   Assessment & Plan Chalazion of left lower eyelid Reviewed natural history of process. Advise warm compresses. Advise to stop red relief drops due to vasocontrictor element and transition to hydrating drops.  Bright red rectal bleeding Gave patient and her daughter the number to GI. No further BRBPR, but due for age-appropriate CA screening nonetheless. Needs colonoscopy. They will call for appointment.      Alexa Andrews, MD [facility]

## 2023-10-04 ENCOUNTER — Other Ambulatory Visit: Payer: Self-pay

## 2023-10-19 ENCOUNTER — Other Ambulatory Visit: Payer: Self-pay

## 2023-10-19 ENCOUNTER — Emergency Department (HOSPITAL_COMMUNITY)
Admission: EM | Admit: 2023-10-19 | Discharge: 2023-10-20 | Discharge status: Against Medical Advice | Attending: Emergency Medicine | Admitting: Emergency Medicine

## 2023-10-19 ENCOUNTER — Encounter (HOSPITAL_COMMUNITY): Payer: Self-pay | Admitting: Emergency Medicine

## 2023-10-19 DIAGNOSIS — Z5321 Procedure and treatment not carried out due to patient leaving prior to being seen by health care provider: Secondary | ICD-10-CM | POA: Insufficient documentation

## 2023-10-19 DIAGNOSIS — R1011 Right upper quadrant pain: Secondary | ICD-10-CM | POA: Diagnosis not present

## 2023-10-19 LAB — URINALYSIS, ROUTINE W REFLEX MICROSCOPIC
Bacteria, UA: NONE SEEN
Bilirubin Urine: NEGATIVE
Glucose, UA: NEGATIVE mg/dL
Hgb urine dipstick: NEGATIVE
Ketones, ur: NEGATIVE mg/dL
Leukocytes,Ua: NEGATIVE
Nitrite: NEGATIVE
Protein, ur: NEGATIVE mg/dL
Specific Gravity, Urine: 1.005 (ref 1.005–1.030)
pH: 6 (ref 5.0–8.0)

## 2023-10-19 LAB — CBC WITH DIFFERENTIAL/PLATELET
Abs Immature Granulocytes: 0.03 10*3/uL (ref 0.00–0.07)
Basophils Absolute: 0.1 10*3/uL (ref 0.0–0.1)
Basophils Relative: 1 %
Eosinophils Absolute: 0.2 10*3/uL (ref 0.0–0.5)
Eosinophils Relative: 3 %
HCT: 40.9 % (ref 36.0–46.0)
Hemoglobin: 12.3 g/dL (ref 12.0–15.0)
Immature Granulocytes: 0 %
Lymphocytes Relative: 40 %
Lymphs Abs: 3.8 10*3/uL (ref 0.7–4.0)
MCH: 23.3 pg — ABNORMAL LOW (ref 26.0–34.0)
MCHC: 30.1 g/dL (ref 30.0–36.0)
MCV: 77.6 fL — ABNORMAL LOW (ref 80.0–100.0)
Monocytes Absolute: 0.7 10*3/uL (ref 0.1–1.0)
Monocytes Relative: 7 %
Neutro Abs: 4.6 10*3/uL (ref 1.7–7.7)
Neutrophils Relative %: 49 %
Platelets: 203 10*3/uL (ref 150–400)
RBC: 5.27 MIL/uL — ABNORMAL HIGH (ref 3.87–5.11)
RDW: 15.1 % (ref 11.5–15.5)
WBC: 9.3 10*3/uL (ref 4.0–10.5)
nRBC: 0 % (ref 0.0–0.2)

## 2023-10-19 LAB — COMPREHENSIVE METABOLIC PANEL WITH GFR
ALT: 13 U/L (ref 0–44)
AST: 21 U/L (ref 15–41)
Albumin: 3.6 g/dL (ref 3.5–5.0)
Alkaline Phosphatase: 64 U/L (ref 38–126)
Anion gap: 9 (ref 5–15)
BUN: 10 mg/dL (ref 6–20)
CO2: 27 mmol/L (ref 22–32)
Calcium: 9.1 mg/dL (ref 8.9–10.3)
Chloride: 104 mmol/L (ref 98–111)
Creatinine, Ser: 0.84 mg/dL (ref 0.44–1.00)
GFR, Estimated: >60 mL/min (ref >60)
Glucose, Bld: 145 mg/dL — ABNORMAL HIGH (ref 70–99)
Potassium: 3.4 mmol/L — ABNORMAL LOW (ref 3.5–5.1)
Sodium: 140 mmol/L (ref 135–145)
Total Bilirubin: 0.5 mg/dL (ref 0.0–1.2)
Total Protein: 6.9 g/dL (ref 6.5–8.1)

## 2023-10-19 LAB — LIPASE, BLOOD: Lipase: 42 U/L (ref 11–51)

## 2023-10-19 LAB — PREGNANCY, URINE: Preg Test, Ur: NEGATIVE

## 2023-10-19 NOTE — ED Provider Triage Note (Signed)
 Emergency Medicine Provider Triage Evaluation Note  [name] , a 43 y.o. female  was evaluated in triage.  Pt complains of abdominal pain.  Review of Systems  Positive: AP RUQ Negative: Vomiting, fever  Physical Exam  BP 125/79 (BP Location: Left Arm)   Pulse 70   Temp 98.1 F (36.7 C)   Resp 20   Wt 65.8 kg   SpO2 97%   BMI 27.40 kg/m  Gen:   Awake, no distress   Resp:  Normal effort  MSK:   Moves extremities without difficulty  Other:    Medical Decision Making  Medically screening exam initiated at 8:45 PM.  Appropriate orders placed.  [name] was informed that the remainder of the evaluation will be completed by another provider, this initial triage assessment does not replace that evaluation, and the importance of remaining in the ED until their evaluation is complete.  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 No nausea, vomiting.    Mandy Second, PA-C 10/19/23 2046

## 2023-10-19 NOTE — ED Triage Notes (Signed)
 Patient c/o RUQ pain that started while she was at work this evening.  Patient denies n/v/d.  Patient gives verbal consent for MSE.

## 2023-10-20 NOTE — ED Notes (Signed)
Called for patient 3 times, No answer.

## 2023-12-13 ENCOUNTER — Encounter: Payer: Self-pay | Admitting: Family Medicine

## 2024-01-04 ENCOUNTER — Other Ambulatory Visit (HOSPITAL_COMMUNITY): Payer: Self-pay

## 2024-01-19 ENCOUNTER — Ambulatory Visit: Admitting: Family Medicine

## 2024-01-19 ENCOUNTER — Other Ambulatory Visit (HOSPITAL_COMMUNITY): Payer: Self-pay

## 2024-01-19 ENCOUNTER — Encounter: Payer: Self-pay | Admitting: Family Medicine

## 2024-01-19 VITALS — BP 119/97 | HR 62 | Wt 142.8 lb

## 2024-01-19 DIAGNOSIS — R030 Elevated blood-pressure reading, without diagnosis of hypertension: Secondary | ICD-10-CM

## 2024-01-19 DIAGNOSIS — Z1211 Encounter for screening for malignant neoplasm of colon: Secondary | ICD-10-CM

## 2024-01-19 DIAGNOSIS — K5900 Constipation, unspecified: Secondary | ICD-10-CM

## 2024-01-19 MED ORDER — SENNA 8.6 MG PO TABS
1.0000 | ORAL_TABLET | Freq: Every day | ORAL | 0 refills | Status: AC
  Filled 2024-01-19: qty 5, 5d supply, fill #0

## 2024-01-19 MED ORDER — POLYETHYLENE GLYCOL 3350 17 GM/SCOOP PO POWD
17.0000 g | Freq: Every day | ORAL | 0 refills | Status: AC
  Filled 2024-01-19: qty 238, 14d supply, fill #0

## 2024-01-19 NOTE — Patient Instructions (Signed)
 It was wonderful to see you today.  Please bring ALL of your medications with you to every visit.   Today we talked about:  Constipation - Please take one cap of miralax  twice a day for 5 days. During these five days you can also do the senna tablet. Then do miralax  daily for 5 days. Then do miralax  every other day or 3 times a week. If you start to have diarrhea you can decrease the amount or frequency of the miralax  but try to continue it.   I have also put in a referral to a gastroenterologist for a colonoscopy. Please answer unknown numbers.   A number of foods worsen constipation- try to limit/avoid these: - high fat foods - highly processed foods  - cheese   Things that can help eat regularly and take time to eat  drink >= 8 cups of fluid daily, especially water or other noncaffeinated drinks  limit tea and coffee to <= 3 cups daily  reduce consumption of alcoholic and carbonated beverages  reduce consumption of 'resistant starch' (starch that resists digestion in the small intestine and reaches the colon intact), which is often found in processed or recooked foods  people with gas and bloating may find it helpful to incorporate oats and linseeds (up to 1 tablespoon per day) in their diet    Miralax  is a safe, over the counter powder which can be used for constipation.Mix 1 capful of Miralax  in 8 ounces of water. You can take this up to three times a day. Titrate to a soft stool. You want your stools to be soft---- not very watery. If you develop watery stools, reduce Miralax  to once a day.    Please follow up in 2-4 weeks   Thank you for choosing [facility].   Please call [phone] with any questions about today's appointment.  Please be sure to schedule follow up at the front desk before you leave today.   Areta Saliva, MD  Family Medicine

## 2024-01-19 NOTE — Progress Notes (Signed)
    SUBJECTIVE:   CHIEF COMPLAINT / HPI:   Constipation  Patient's last BM was yesterday. Says her stools have been small and hard for the last 2 weeks. She has been having small stools everyday. She feels like there is more stool that she cannot evacuate. Patient says they have been using miralax  and senna. Tried miralax  for 4 days in a row, 1 cap per day starting 2 weeks ago. Tried senna tablet once later and says they stopped all methods since they felt it didn't work. She has not had any hematochezia or melena.  She has been having constipation for years. Recently visited in March and may for constipation and bright red blood per rectum consistent with her previous hemorrhoids. She had previous referral to GI for colonoscopy for colorectal cancer screening. Unfortunately they were not able to get in contact with patient for scheduling.   PERTINENT  PMH / PSH: H/o constipation, hemorrhoids   OBJECTIVE:   BP (!) 119/97   Pulse 62   Wt 142 lb 12.8 oz (64.8 kg)   SpO2 98%   BMI 26.98 kg/m   General: well appearing, in no acute distress CV: well pefused, radial pulses equal and palpable, no BLE edema  Resp: Normal work of breathing on room air Abd: Soft, non distended, mildly tender to palpation below umbilicus feels full in this area.     ASSESSMENT/PLAN:   Assessment & Plan Constipation, unspecified constipation type Patient most likely has idiopathic chronic constipation.  Has been off and on for multiple years.  Fortunately no alarm signs given no hematochezia or vomiting. - Counseled regarding nutrition and exercise that can help with constipation - MiraLAX  twice daily for 5 days with senna for 5 days then wean and titrate for daily soft bowel movement. - Referral to GI for colonoscopy for routine cancer screening. -Follow-up in 2 to 4 weeks Elevated blood pressure reading Patient with elevated diastolic blood pressure in office today.  Does not have history of hypertension  diagnosis.  Not on any medications currently. - Follow-up in 2 weeks for follow-up blood pressure.     Areta Saliva, MD [facility]

## 2024-02-09 ENCOUNTER — Ambulatory Visit: Admitting: Family Medicine

## 2024-02-11 ENCOUNTER — Encounter: Payer: Self-pay | Admitting: Student

## 2024-02-11 ENCOUNTER — Ambulatory Visit: Admitting: Student

## 2024-02-11 VITALS — BP 104/73 | HR 75 | Ht 61.0 in | Wt 144.4 lb

## 2024-02-11 DIAGNOSIS — K5900 Constipation, unspecified: Secondary | ICD-10-CM

## 2024-02-11 NOTE — Assessment & Plan Note (Addendum)
 Improved. No Nausea, vomiting, abdominal pain or hematochezia. -Continue MiraLAX  2 times daily with senna - Can reduce to MiraLAX  daily once stable - Encourage patient to consider over-the-counter Metamucil for maintenance if having diarrhea with MiraLAX  - Provided patient with contact for colonoscopy - Encourage adequate water intake. - Increase fruits and vegetable intake especially Kiwi and Dates.

## 2024-02-11 NOTE — Patient Instructions (Addendum)
 Pleasure to meet you today.  Glad to hear that the MiraLAX  and senna helped with the constipation.  Continue to take the MiraLAX  2 times a day if you still having difficulty with constipation.  If you develop diarrhea you can switch to once daily.  If you are against taking MiraLAX  daily you can do over-the-counter fiber supplements known as Metamucil.  I also recommend making sure you are drinking adequately at least 50 ounces daily.  You will also benefit from increasing intake of fruits and vegetable.  Kiwi and dates are very good for constipation.  Below is the contact for your colonoscopy.  Please call them to schedule this appointment.  [facility] [address] [place] [address] [place],  [place]  [address]:  [phone]

## 2024-02-11 NOTE — Progress Notes (Signed)
    SUBJECTIVE:   CHIEF COMPLAINT / HPI:   43 year old female with history of GERD Presenting today for follow-up for chronic constipation Per last visit patient was started on MiraLAX  twice daily for 5 days with senna Today patient reports improvement of constipation on the medication. However constipation returned after she halted or decreased her MiraLAX  to once daily Last bowel movement was yesterday Denies any abdominal pain, nausea, vomiting , fever or blood in stool  PERTINENT  PMH / PSH: Reviewed   OBJECTIVE:   BP 104/73   Pulse 75   Ht 5' 1 (1.549 m)   Wt 144 lb 6.4 oz (65.5 kg)   SpO2 96%   BMI 27.28 kg/m    Physical Exam General: Alert, well appearing, NAD Cardiovascular: RRR, No Murmurs, Normal S2/S2 Respiratory: CTAB, No wheezing or Rales Abdomen: No distension or tenderness Extremities: No edema on extremities    ASSESSMENT/PLAN:   Constipation Improved. No Nausea, vomiting, abdominal pain or hematochezia. -Continue MiraLAX  2 times daily with senna - Can reduce to MiraLAX  daily once stable - Encourage patient to consider over-the-counter Metamucil for maintenance if having diarrhea with MiraLAX  - Provided patient with contact for colonoscopy - Encourage adequate water intake. - Increase fruits and vegetable intake especially Kiwi and Dates.    Norleen April, MD [facility]

## 2024-05-02 ENCOUNTER — Encounter: Payer: Self-pay | Admitting: Internal Medicine
